# Patient Record
Sex: Male | Born: 1990 | Hispanic: No | Marital: Single | Smoking: Former smoker
Health system: Southern US, Community
[De-identification: ages and names within clinical notes are randomized; demographics above are authoritative.]

## PROBLEM LIST (undated history)

## (undated) DIAGNOSIS — J45909 Unspecified asthma, uncomplicated: Secondary | ICD-10-CM

---
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
## 2010-03-23 ENCOUNTER — Emergency Department (HOSPITAL_COMMUNITY): Admission: EM | Admit: 2010-03-23 | Discharge: 2010-03-23 | Payer: Self-pay | Admitting: Emergency Medicine

## 2010-09-11 ENCOUNTER — Emergency Department (HOSPITAL_COMMUNITY)
Admission: EM | Admit: 2010-09-11 | Discharge: 2010-09-11 | Payer: Self-pay | Source: Home / Self Care | Admitting: Emergency Medicine

## 2011-04-16 ENCOUNTER — Emergency Department (HOSPITAL_COMMUNITY)
Admission: EM | Admit: 2011-04-16 | Discharge: 2011-04-16 | Disposition: A | Discharge status: Home / Self Care | Payer: Self-pay | Attending: Emergency Medicine | Admitting: Emergency Medicine

## 2011-04-16 DIAGNOSIS — H60399 Other infective otitis externa, unspecified ear: Secondary | ICD-10-CM | POA: Insufficient documentation

## 2011-04-16 DIAGNOSIS — H9209 Otalgia, unspecified ear: Secondary | ICD-10-CM | POA: Insufficient documentation

## 2012-06-19 ENCOUNTER — Encounter (HOSPITAL_COMMUNITY): Payer: Self-pay | Admitting: Emergency Medicine

## 2012-06-19 ENCOUNTER — Emergency Department (HOSPITAL_COMMUNITY)
Admission: EM | Admit: 2012-06-19 | Discharge: 2012-06-19 | Disposition: A | Discharge status: Home / Self Care | Payer: Self-pay | Attending: Emergency Medicine | Admitting: Emergency Medicine

## 2012-06-19 DIAGNOSIS — F17200 Nicotine dependence, unspecified, uncomplicated: Secondary | ICD-10-CM | POA: Insufficient documentation

## 2012-06-19 DIAGNOSIS — J45909 Unspecified asthma, uncomplicated: Secondary | ICD-10-CM | POA: Insufficient documentation

## 2012-06-19 DIAGNOSIS — R091 Pleurisy: Secondary | ICD-10-CM

## 2012-06-19 DIAGNOSIS — R51 Headache: Secondary | ICD-10-CM | POA: Insufficient documentation

## 2012-06-19 DIAGNOSIS — R071 Chest pain on breathing: Secondary | ICD-10-CM | POA: Insufficient documentation

## 2012-06-19 HISTORY — DX: Unspecified asthma, uncomplicated: J45.909

## 2012-06-19 MED ORDER — IBUPROFEN 800 MG PO TABS
800.0000 mg | ORAL_TABLET | Freq: Once | ORAL | Status: AC
  Administered 2012-06-19: 800 mg via ORAL
  Filled 2012-06-19: qty 1

## 2012-06-19 MED ORDER — ACETAMINOPHEN 325 MG PO TABS
650.0000 mg | ORAL_TABLET | Freq: Once | ORAL | Status: AC
  Administered 2012-06-19: 650 mg via ORAL
  Filled 2012-06-19: qty 2

## 2012-06-19 NOTE — ED Provider Notes (Signed)
History     CSN: [id number]  Arrival date & time 06/19/12  1002   First MD Initiated Contact with Patient 06/19/12 1013      Chief Complaint  Patient presents with  . Headache    temporal  . Pleurisy    chest pain on inhalation    (Consider location/radiation/quality/duration/timing/severity/associated sxs/prior treatment) HPI  Patient with left sided chest pain began at 0600 this a.m.  Increases with inspiration especially deep breath.  No dyspnea,cough, or fever.  Patient states headache like prior migraine bitemporal and usually occur bimonthly.  This began last night with mild headache followed by worsening headache, no associated visual changes nausea or vomiting, fever, chills.  Pain like prior headache. Patient took ibuprofen without relief.   Past Medical History  Diagnosis Date  . Asthma     History reviewed. No pertinent past surgical history.  No family history on file.  History  Substance Use Topics  . Smoking status: Current Every Day Smoker -- 0.5 packs/day    Types: Cigarettes  . Smokeless tobacco: Never Used  . Alcohol Use:       Review of Systems  Constitutional: Negative for fever and chills.  HENT: Negative for neck stiffness.   Eyes: Negative for visual disturbance.  Respiratory: Negative for shortness of breath.   Cardiovascular: Negative for chest pain.  Gastrointestinal: Negative for vomiting, diarrhea and blood in stool.  Genitourinary: Negative for dysuria, frequency and decreased urine volume.  Musculoskeletal: Negative for myalgias and joint swelling.  Skin: Negative for rash.  Neurological: Negative for weakness.  Hematological: Negative for adenopathy.  Psychiatric/Behavioral: Negative for agitation.    Allergies  Review of patient's allergies indicates no known allergies.  Home Medications   Current Outpatient Rx  Name Route Sig Dispense Refill  . IBUPROFEN 200 MG PO TABS Oral Take 200 mg by mouth every 6 (six) hours as  needed. Pain      BP 122/69  Pulse 82  Temp 98.5 F (36.9 C) (Oral)  Resp 16  Ht 5\' 10"  (1.778 m)  Wt 156 lb (70.761 kg)  BMI 22.38 kg/m2  SpO2 100%  Physical Exam  Nursing note and vitals reviewed. Constitutional: He is oriented to person, place, and time. He appears well-developed and well-nourished.  HENT:  Head: Normocephalic and atraumatic.  Right Ear: External ear normal.  Left Ear: External ear normal.  Nose: Nose normal.  Mouth/Throat: Oropharynx is clear and moist.  Eyes: Conjunctivae normal and EOM are normal. Pupils are equal, round, and reactive to light.  Neck: Normal range of motion. Neck supple.  Cardiovascular: Normal rate, regular rhythm, normal heart sounds and intact distal pulses.   Pulmonary/Chest: Effort normal and breath sounds normal. No respiratory distress. He has no wheezes. He exhibits no tenderness.  Abdominal: Soft. Bowel sounds are normal. He exhibits no distension and no mass. There is no tenderness. There is no guarding.  Musculoskeletal: Normal range of motion.  Neurological: He is alert and oriented to person, place, and time. He has normal reflexes. He exhibits normal muscle tone. Coordination normal.  Skin: Skin is warm and dry.  Psychiatric: He has a normal mood and affect. His behavior is normal. Judgment and thought content normal.    ED Course  Procedures (including critical care time)  Labs Reviewed - No data to display No results found.   No diagnosis found.    MDM    Patient perc negative with pleuritic chest pain.  Patient advised tylenol and continued ibuprofen  for chest pain and headache.        Hilario Quarry, MD 06/19/12 [phone]

## 2012-06-19 NOTE — Discharge Instructions (Signed)
plPleurisy Pleurisy is an irritation (inflammation) and puffiness (swelling) of the lining of the lungs. It is often caused by an existing infection or disease. It can be hard to breathe and hurt to breathe. Coughing or deep breathing will make it hurt more. HOME CARE  Only take medicine as told by your doctor.   Take your medicines (antibiotics) as told. Finish them even if you start to feel better.   Use a cool mist vaporizer to loosen mucus.  GET HELP RIGHT AWAY IF:   Your lips, fingernails, or toenails are blue or dark.   You cough up blood.   You have a hard time breathing.   Your pain is not controlled with medicine or it lasts for more than 1 week.   Your pain spreads (radiates) into your neck, arms, or jaw.   You are short of breath or wheezing.   You develop a fever, rash, throw up (vomit), or faint.   Your pain gets worse.   You cough up more yellowish white (pus-like) mucus.  MAKE SURE YOU:   Understand these instructions.   Will watch your condition.   Will get help right away if you are not doing well or get worse.  Document Released: 09/04/2008 Document Revised: 09/11/2011 Document Reviewed: 12/20/2009 [facility] Patient Information 2012 [place], [place].Migraine Headache A migraine headache is an intense, throbbing pain on one or both sides of your head. The exact cause of a migraine headache is not always known. A migraine may be caused when nerves in the brain become irritated and release chemicals that cause swelling within blood vessels, causing pain. Many migraine sufferers have a family history of migraines. Before you get a migraine you may or may not get an aura. An aura is a group of symptoms that can predict the beginning of a migraine. An aura may include:  Visual changes such as:   Flashing lights.   Bright spots or zig-zag lines.   Tunnel vision.   Feelings of numbness.   Trouble talking.   Muscle weakness.  SYMPTOMS  Pain on one or both  sides of your head.   Pain that is pulsating or throbbing in nature.   Pain that is severe enough to prevent daily activities.   Pain that is aggravated by any daily physical activity.   Nausea (feeling sick to your stomach), vomiting, or both.   Pain with exposure to bright lights, loud noises, or activity.   General sensitivity to bright lights or loud noises.  MIGRAINE TRIGGERS Examples of triggers of migraine headaches include:   Alcohol.   Smoking.   Stress.   It may be related to menses (male menstruation).   Aged cheeses.   Foods or drinks that contain nitrates, glutamate, aspartame, or tyramine.   Lack of sleep.   Chocolate.   Caffeine.   Hunger.   Medications such as nitroglycerine (used to treat chest pain), birth control pills, estrogen, and some blood pressure medications.  DIAGNOSIS  A migraine headache is often diagnosed based on:  Symptoms.   Physical examination.   A computerized X-Jerrold Haskell scan (computed tomography, CT) of your head.  TREATMENT  Medications can help prevent migraines if they are recurrent or should they become recurrent. Your caregiver can help you with a medication or treatment program that will be helpful to you.   Lying down in a dark, quiet room may be helpful.   Keeping a headache diary may help you find a trend as to what may be  triggering your headaches.  SEEK IMMEDIATE MEDICAL CARE IF:   You have confusion, personality changes or seizures.   You have headaches that wake you from sleep.   You have an increased frequency in your headaches.   You have a stiff neck.   You have a loss of vision.   You have muscle weakness.   You start losing your balance or have trouble walking.   You feel faint or pass out.  MAKE SURE YOU:   Understand these instructions.   Will watch your condition.   Will get help right away if you are not doing well or get worse.  Document Released: 09/22/2005 Document Revised:  09/11/2011 Document Reviewed: 05/08/2009 [facility] Patient Information 2012 [place], [place].

## 2012-06-19 NOTE — ED Notes (Signed)
Pt presents w/ temporal headache onset yesterday. And chest pain w/ inspiration. Has been lifting heavy boxes at work. Pt took ibuprofren 2230 last p.m. W/o relief.

## 2012-09-07 ENCOUNTER — Encounter (HOSPITAL_COMMUNITY): Payer: Self-pay | Admitting: *Deleted

## 2012-09-07 ENCOUNTER — Emergency Department (HOSPITAL_COMMUNITY)
Admission: EM | Admit: 2012-09-07 | Discharge: 2012-09-07 | Disposition: A | Discharge status: Home / Self Care | Payer: Self-pay | Attending: Emergency Medicine | Admitting: Emergency Medicine

## 2012-09-07 DIAGNOSIS — B079 Viral wart, unspecified: Secondary | ICD-10-CM | POA: Insufficient documentation

## 2012-09-07 DIAGNOSIS — F17200 Nicotine dependence, unspecified, uncomplicated: Secondary | ICD-10-CM | POA: Insufficient documentation

## 2012-09-07 DIAGNOSIS — J45909 Unspecified asthma, uncomplicated: Secondary | ICD-10-CM | POA: Insufficient documentation

## 2012-09-07 MED ORDER — BACITRACIN-NEOMYCIN-POLYMYXIN 400-5-5000 EX OINT
TOPICAL_OINTMENT | Freq: Once | CUTANEOUS | Status: AC
  Administered 2012-09-07: 02:00:00 via TOPICAL
  Filled 2012-09-07: qty 1

## 2012-09-07 NOTE — ED Provider Notes (Signed)
History     CSN: [id number]  Arrival date & time 09/07/12  0109   First MD Initiated Contact with Patient 09/07/12 0129      No chief complaint on file.   (Consider location/radiation/quality/duration/timing/severity/associated sxs/prior treatment) HPI Comments: patient with acute onset of growth on L ear lobe that has slowly gotten larger and occasionally bleeds   The history is provided by the patient.    Past Medical History  Diagnosis Date  . Asthma     History reviewed. No pertinent past surgical history.  No family history on file.  History  Substance Use Topics  . Smoking status: Current Every Day Smoker -- 0.5 packs/day    Types: Cigarettes  . Smokeless tobacco: Never Used  . Alcohol Use:       Review of Systems  Constitutional: Negative for fever and chills.  HENT: Negative for ear pain.   Skin: Positive for wound.  Neurological: Negative for dizziness and headaches.    Allergies  Review of patient's allergies indicates no known allergies.  Home Medications   Current Outpatient Rx  Name  Route  Sig  Dispense  Refill  . IBUPROFEN 200 MG PO TABS   Oral   Take 200 mg by mouth every 6 (six) hours as needed. Pain           BP 135/68  Pulse 72  Temp 97.8 F (36.6 C) (Oral)  Resp 18  SpO2 100%  Physical Exam  Constitutional: He appears well-developed and well-nourished.  HENT:  Head: Normocephalic.  Right Ear: External ear normal.  Ears:  Eyes: Pupils are equal, round, and reactive to light.  Neck: Normal range of motion.  Cardiovascular: Normal rate.   Pulmonary/Chest: Effort normal.  Abdominal: Soft.  Musculoskeletal: Normal range of motion.  Neurological: He is alert.  Skin: Skin is warm. No rash noted. No erythema.    ED Course  Procedures (including critical care time)  Labs Reviewed - No data to display No results found.   1. Wart       MDM   Wart on L ear lobe         Arman Filter, NP 09/07/12 0140

## 2012-09-07 NOTE — ED Provider Notes (Signed)
Medical screening examination/treatment/procedure(s) were performed by non-physician practitioner and as supervising physician I was immediately available for consultation/collaboration.    Nelia Shi, MD 09/07/12 [phone]

## 2012-09-07 NOTE — ED Notes (Signed)
Pt has area on left ear x 2 wks; states is irritated and bleeds at times

## 2012-09-07 NOTE — ED Notes (Signed)
NP at bedside.

## 2014-05-16 ENCOUNTER — Emergency Department (HOSPITAL_COMMUNITY)
Admission: EM | Admit: 2014-05-16 | Discharge: 2014-05-16 | Disposition: A | Discharge status: Home / Self Care | Payer: Self-pay | Attending: Emergency Medicine | Admitting: Emergency Medicine

## 2014-05-16 ENCOUNTER — Encounter (HOSPITAL_COMMUNITY): Payer: Self-pay | Admitting: Emergency Medicine

## 2014-05-16 DIAGNOSIS — K089 Disorder of teeth and supporting structures, unspecified: Secondary | ICD-10-CM | POA: Insufficient documentation

## 2014-05-16 DIAGNOSIS — K029 Dental caries, unspecified: Secondary | ICD-10-CM | POA: Insufficient documentation

## 2014-05-16 DIAGNOSIS — H9209 Otalgia, unspecified ear: Secondary | ICD-10-CM | POA: Insufficient documentation

## 2014-05-16 DIAGNOSIS — Z792 Long term (current) use of antibiotics: Secondary | ICD-10-CM | POA: Insufficient documentation

## 2014-05-16 DIAGNOSIS — R51 Headache: Secondary | ICD-10-CM | POA: Insufficient documentation

## 2014-05-16 DIAGNOSIS — Z791 Long term (current) use of non-steroidal anti-inflammatories (NSAID): Secondary | ICD-10-CM | POA: Insufficient documentation

## 2014-05-16 DIAGNOSIS — R6884 Jaw pain: Secondary | ICD-10-CM | POA: Insufficient documentation

## 2014-05-16 DIAGNOSIS — F17200 Nicotine dependence, unspecified, uncomplicated: Secondary | ICD-10-CM | POA: Insufficient documentation

## 2014-05-16 MED ORDER — HYDROCODONE-ACETAMINOPHEN 5-325 MG PO TABS: 1.0000 | ORAL_TABLET | ORAL | Status: DC | PRN

## 2014-05-16 MED ORDER — HYDROCODONE-ACETAMINOPHEN 5-325 MG PO TABS
1.0000 | ORAL_TABLET | Freq: Once | ORAL | Status: AC
  Administered 2014-05-16: 1 via ORAL
  Filled 2014-05-16: qty 1

## 2014-05-16 MED ORDER — IBUPROFEN 800 MG PO TABS: 800.0000 mg | ORAL_TABLET | Freq: Three times a day (TID) | ORAL | Status: DC

## 2014-05-16 MED ORDER — IBUPROFEN 800 MG PO TABS
800.0000 mg | ORAL_TABLET | Freq: Once | ORAL | Status: AC
  Administered 2014-05-16: 800 mg via ORAL
  Filled 2014-05-16: qty 1

## 2014-05-16 MED ORDER — PENICILLIN V POTASSIUM 500 MG PO TABS: 500.0000 mg | ORAL_TABLET | Freq: Three times a day (TID) | ORAL | Status: DC

## 2014-05-16 NOTE — ED Notes (Signed)
Pt c/o right side jaw pain that started last week.  Pt states that it hurts to chew and open mouth wide.  Pt states gums feels swollen and when he presses down on it he has bad taste in his mouth.

## 2014-05-16 NOTE — Discharge Instructions (Signed)
1. Medications: vicodin, penicillin, usual home medications °2. Treatment: rest, drink plenty of fluids, take medications as prescribed °3. Follow Up: Please followup with your primary doctor for discussion of your diagnoses and further evaluation after today's visit; if you do not have a primary care doctor use the resource guide provided to find one; f/u with dentistry as discussed ° ° ° °Dental Pain °A tooth ache may be caused by cavities (tooth decay). Cavities expose the nerve of the tooth to air and hot or cold temperatures. It may come from an infection or abscess (also called a boil or furuncle) around your tooth. It is also often caused by dental caries (tooth decay). This causes the pain you are having. °DIAGNOSIS  °Your caregiver can diagnose this problem by exam. °TREATMENT  °· If caused by an infection, it may be treated with medications which kill germs (antibiotics) and pain medications as prescribed by your caregiver. Take medications as directed. °· Only take over-the-counter or prescription medicines for pain, discomfort, or fever as directed by your caregiver. °· Whether the tooth ache today is caused by infection or dental disease, you should see your dentist as soon as possible for further care. °SEEK MEDICAL CARE IF: °The exam and treatment you received today has been provided on an emergency basis only. This is not a substitute for complete medical or dental care. If your problem worsens or new problems (symptoms) appear, and you are unable to meet with your dentist, call or return to this location. °SEEK IMMEDIATE MEDICAL CARE IF:  °· You have a fever. °· You develop redness and swelling of your face, jaw, or neck. °· You are unable to open your mouth. °· You have severe pain uncontrolled by pain medicine. °MAKE SURE YOU:  °· Understand these instructions. °· Will watch your condition. °· Will get help right away if you are not doing well or get worse. °Document Released: 09/22/2005 Document  Revised: 12/15/2011 Document Reviewed: 05/10/2008 °ExitCare® Patient Information ©2015 [employer]. This information is not intended to replace advice given to you by your health care provider. Make sure you discuss any questions you have with your health care provider. ° ° ° °Emergency Department Resource Guide °1) Find a Doctor and Pay Out of Pocket °Although you won't have to find out who is covered by your insurance plan, it is a good idea to ask around and get recommendations. You will then need to call the office and see if the doctor you have chosen will accept you as a new patient and what types of options they offer for patients who are self-pay. Some doctors offer discounts or will set up payment plans for their patients who do not have insurance, but you will need to ask so you aren't surprised when you get to your appointment. ° °2) Contact Your Local Health Department °Not all health departments have doctors that can see patients for sick visits, but many do, so it is worth a call to see if yours does. If you don't know where your local health department is, you can check in your phone book. The CDC also has a tool to help you locate your state's health department, and many state websites also have listings of all of their local health departments. ° °3) Find a Walk-in Clinic °If your illness is not likely to be very severe or complicated, you may want to try a walk in clinic. These are popping up all over the country in pharmacies, drugstores, and   shopping centers. They're usually staffed by nurse practitioners or physician assistants that have been trained to treat common illnesses and complaints. They're usually fairly quick and inexpensive. However, if you have serious medical issues or chronic medical problems, these are probably not your best option. ° °No Primary Care Doctor: °- Call Health Connect at  832-8000 - they can help you locate a primary care doctor that  accepts your insurance,  provides certain services, etc. °- Physician Referral Service- 1-800-533-3463 ° °Chronic Pain Problems: °Organization         Address  Phone   Notes  °[facility]  (336) 297-2271 Patients need to be referred by their primary care doctor.  ° °Medication Assistance: °Organization         Address  Phone   Notes  °[place] [facility] [address] °[place], [place] [address] (336) 641-8030 --Must be a resident of [place] °-- Must have NO insurance coverage whatsoever (no Medicaid/ Medicare, etc.) °-- The pt. MUST have a primary care doctor that directs their care regularly and follows them in the community °  °[facility]  (866) 331-1348   °[employer]  (888) 892-1162   ° °Agencies that provide inexpensive medical care: °Organization         Address  Phone   Notes  °[facility]  (336) 832-8035   °[facility]    (336) 832-7272   °[facility] [address] °[place], [place] [address] (336) 832-4777   °[facility] [address], °[place] (336) 271-4999   °[facility]    (336) 373-0678   °[facility]    (336) 272-1050   °[facility] ° [address], [place] Phone:  (336) 832-4444, Fax:  (336) 832-4440 Hours of Operation:  9 am - 6 pm, M-F.  Also accepts Medicaid/Medicare and self-pay.  °[facility] ° [address], [place] Phone: (336) 832-3150, Fax: (336) 832-3151. Hours of Operation:  8:30 am - 5:30 pm, M-F.  Also accepts Medicaid and self-pay.  °[facility] [place] [address], [place] Phone: (336) 878-6027   °[facility] [address], [place], [place] (336)723-1848, Ext. 123 Mondays & Thursdays: 7-9 AM.  First 15 patients are seen on a first come, first serve basis. °  ° °Medicaid-accepting [place] Providers: ° °Organization         Address  Phone    Notes  °[facility] [address], [place] (336) 641-2100 Also accepts self-pay patients.  °[facility] [address], [place] ° (336) 856-9996   °[facility] [address], [place] (336) 288-8857   °[facility] [address] [place] [address], [place] (336) 299-7000   °Veita Bland [address], [place]  ° (336) 373-1557 Only accepts [place] Access Medicaid patients after they have their name applied to their card.  ° °Self-Pay (no insurance) in [place]: ° °Organization         Address  Phone   Notes  °Sickle Cell Patients, [facility] [address], [place] (336) 832-1970   °[facility] [address], [place] (336) 832-4400   °[facility] [place] ° [address], [place] (336) 992-4800   °  [facility]/Dr. Osei-Bonsu ° [address] [place] [address], [place] or [address], [place] (336) 841-8500 Phone number for both [place] and [place] locations is the same.  °[facility] [address], [place] (336) 299-0000   °[facility] [address] [place] [address], [place] or [address] (336) 852-7530 °(336) 878-2260   °[facility] [address], [place] (336) 350-1642, phone; (336) 294-5005, fax Sees patients 1st and 3rd Saturday of every month.  Must not qualify for public or private insurance (i.e. Medicaid, Medicare, [place] Health Choice, Veterans' Benefits) • Household income should be no more than 200% of the poverty level •The clinic cannot treat you if you are pregnant or think you are pregnant • Sexually transmitted diseases are not treated at the clinic.  ° ° °Dental Care: °Organization         Address  Phone  Notes  °[facility] [address], [place] (336)  641-6152 Accepts children up to age 21 who are enrolled in Medicaid or [place] Health Choice; pregnant women with a Medicaid card; and children who have applied for Medicaid or [place] Health Choice, but were declined, whose parents can pay a reduced fee at time of service.  °[facility] [place]  [address], [place] (336) 641-7733 Accepts children up to age 21 who are enrolled in Medicaid or [place] Health Choice; pregnant women with a Medicaid card; and children who have applied for Medicaid or [place] Health Choice, but were declined, whose parents can pay a reduced fee at time of service.  °[facility] ° [address], [place] (336) 641-4533 Patients are seen by appointment only. Walk-ins are not accepted. [facility] will see patients 18 years of age and older. °Monday - Tuesday (8am-5pm) °Most Wednesdays (8:30-5pm) °$30 per visit, cash only  °[facility] ° [address], [place] (336) 641-4533 Patients are seen by appointment only. Walk-ins are not accepted. [facility] will see patients 18 years of age and older. °One Wednesday Evening (Monthly: Volunteer Based).  $30 per visit, cash only  °[facility]  (919) 537-3737 for adults; Children under age 4, call Graduate Pediatric Dentistry at (919) [phone]. Children aged 4-14, please call (919) [phone] to request a pediatric application. ° Dental services are provided in all areas of dental care including fillings, crowns and bridges, complete and partial dentures, implants, gum treatment, root canals, and extractions. Preventive care is also provided. Treatment is provided to both adults and children. °Patients are selected via a lottery and there is often a waiting list. °  °[facility] [address], ° ° (336) 763-8833 [email or web address] °  °[facility] [address], [place], [place] (336)723-1848, Ext.  123 Second and Fourth Thursday of each month, opens at 6:30 AM; Clinic ends at 9 AM.  Patients are seen on a first-come first-served basis, and a limited number are seen during each clinic.  ° °[facility] ° [address], [place],  (336) 723-7904   Eligibility Requirements °You must have lived in [place], [place], or [place] counties for at least the last three months. °  You cannot be eligible for state or federal sponsored healthcare insurance, including Veterans Administration, Medicaid, or Medicare. °  You generally cannot be eligible for healthcare insurance   through your employer.  °  How to apply: °Eligibility screenings are held every Tuesday and Wednesday afternoon from 1:00 pm until 4:00 pm. You do not need an appointment for the interview!  °[facility] [address], [place], [place] 336-631-2330   °[facility] [facility]  336-342-8273   °[facility]  336-703-3100   °[facility]  336-570-6415   ° °Behavioral Health Resources in the Community: °Intensive Outpatient Programs °Organization         Address  Phone  Notes  °[facility] [address], [place], [place] 336-878-6098   °[facility] [address], [place], [place] 336-832-9800   °[facility] [address], [place], [place] ° 336-882-2125   °[facility] [address],  °[place], [place] 1-800-853-5163 or 336-641-4981   °Substance Abuse Resources °Organization         Address  Phone  Notes  °[facility]  336-882-2125   °[facility]  336-784-9470   °[facility]  336-285-9073   °[facility]  336-845-3988   °Residential & Outpatient Substance Abuse Program  1-800-659-3381   °Psychological Services °Organization         Address  Phone  Notes  °[facility]  336- 832-9600   °[facility]  336- 378-7881   °[place]  [facility] [address], [place] 1-800-853-5163 or 336-641-4981   ° °Mobile Crisis Teams °Organization         Address  Phone  Notes  °[facility], Mobile Crisis Care Unit  1-877-626-1772   °Assertive °[facility] ° [address] [place], [place] 336-834-9664   °Sharon DeEsch [address] °[place] 336-554-5454   ° °Self-Help/Support Groups °Organization         Address  Phone             Notes  °[facility] - variety of support groups  336- 373-1402 Call for more information  °Narcotics Anonymous (NA), [facility] [address], °[place]  2 meetings at this location  ° °Residential Treatment Programs °Organization         Address  Phone  Notes  °[facility] [address],    °[place]  1-866-801-8205   °[facility] ° [address], [place], [place] 704-293-8524   °[facility] [address], [place] 336-845-3988 Admissions: 8am-3pm M-F  °[facility] [address]    °[place], [place] 336-841-1104   °[facility] [address], [place], [place] 336-379-7146   °[facility] [address]  °[place], [place] 336-285-9073   °[facility] Intensive Outpatient [address], [place], [place] 336-852-3033   °[facility] [address]  °[place], [place] 1-877-615-2722 or 336-784-9470   °[facility] [address] [place], [place] 336-227-7417 Accepts Medicaid  °[facility] [address]  °[place] 1-800-659-3381 Substance Abuse/Addiction Treatment  ° °[place] Behavioral Health Resources °Organization         Address  Phone  Notes  °[facility]  (888) 581-9988   °Julie Brannon, PhD [address] [place], [place]   (336) 349-5553 or (336) 951-0000   °[facility]   [address] °[place], [place] (336) 349-4454   °  [facility] [address], [place], [place] (336) 342-8316 Insurance/Medicaid/sponsorship through [facility]  °[facility] [address]                                    [place], [place] (336) 342-8316 Therapy/tele-psych/case  °[facility] [address]  ° [place], [place] (336) 349-2233    °Dr. Arfeen  (336) 349-4544   °[facility] [place]  [employer] [place] [facility] 1) [address], [place] °2) [address], [place] °3)  [address], [place] (336) 349-3220 °(336) 342-7768 ° °(336) 342-8140   °[place] [facility] (336) 342-1394 or (336) 342-3537 (After Hours)    ° ° ° °

## 2014-05-16 NOTE — ED Provider Notes (Signed)
CSN: [id number]     Arrival date & time 05/16/14  [id number] History   First MD Initiated Contact with Patient 05/16/14 1004     Chief Complaint  Patient presents with  . Jaw Pain     (Consider location/radiation/quality/duration/timing/severity/associated sxs/prior Treatment) The history is provided by the patient and medical records. No language interpreter was used.    Nicholas Blankenship is a 23 y.o. male  with a hx of asthma presents to the Emergency Department complaining of gradual, persistent, progressively worsening right sided dental and jaw pain onset pain worse. Nothing seems to make it better. He has not attempted to take any medications for his pain. He denies fever, chills, nausea, vomiting.  Patient reports he has pain with opening his mouth however he does not have any difficulty opening his mouth. He reports it has been sometime since he saw a dentist.  No other associated symptoms or aggravating or alleviating factors.   Past Medical History  Diagnosis Date  . Asthma    History reviewed. No pertinent past surgical history. No family history on file. History  Substance Use Topics  . Smoking status: Current Every Day Smoker -- 0.50 packs/day    Types: Cigarettes  . Smokeless tobacco: Never Used  . Alcohol Use: No    Review of Systems  Constitutional: Negative for fever, chills and appetite change.  HENT: Positive for dental problem and ear pain. Negative for drooling, facial swelling, nosebleeds, postnasal drip, rhinorrhea and trouble swallowing.   Eyes: Negative for pain and redness.  Respiratory: Negative for cough and wheezing.   Cardiovascular: Negative for chest pain.  Gastrointestinal: Negative for nausea, vomiting and abdominal pain.  Musculoskeletal: Negative for neck pain and neck stiffness.  Skin: Negative for color change and rash.  Neurological: Positive for headaches. Negative for weakness and light-headedness.  All other systems reviewed and are  negative.     Allergies  Review of patient's allergies indicates no known allergies.  Home Medications   Prior to Admission medications   Medication Sig Start Date End Date Taking? Authorizing Provider  HYDROcodone-acetaminophen (NORCO/VICODIN) 5-325 MG per tablet Take 1 tablet by mouth every 4 (four) hours as needed for moderate pain or severe pain. 05/16/14   Anum Palecek, PA-C  ibuprofen (ADVIL,MOTRIN) 200 MG tablet Take 200 mg by mouth every 6 (six) hours as needed. Pain    Historical Provider, MD  ibuprofen (ADVIL,MOTRIN) 800 MG tablet Take 1 tablet (800 mg total) by mouth 3 (three) times daily. 05/16/14   Maximillion Gill, PA-C  penicillin v potassium (VEETID) 500 MG tablet Take 1 tablet (500 mg total) by mouth 3 (three) times daily. 05/16/14   Monaca Wadas, PA-C   BP 131/55  Pulse 70  Temp(Src) 98.2 F (36.8 C) (Oral)  Resp 16  SpO2 99% Physical Exam  Nursing note and vitals reviewed. Constitutional: He is oriented to person, place, and time. He appears well-developed and well-nourished.  HENT:  Head: Normocephalic.  Right Ear: Tympanic membrane, external ear and ear canal normal.  Left Ear: Tympanic membrane, external ear and ear canal normal.  Nose: Nose normal. Right sinus exhibits no maxillary sinus tenderness and no frontal sinus tenderness. Left sinus exhibits no maxillary sinus tenderness and no frontal sinus tenderness.  Mouth/Throat: Uvula is midline, oropharynx is clear and moist and mucous membranes are normal. Mucous membranes are not dry. No oral lesions. Dental caries present. No uvula swelling or lacerations. No oropharyngeal exudate, posterior oropharyngeal edema, posterior oropharyngeal erythema or tonsillar abscesses.  Pain to palpation of tooth #32 with midl erythema and swelling of the associated gingiva; no gross abscess; No woody induration to the floor of the mouth No trismus   Eyes: Conjunctivae are normal. Pupils are equal, round,  and reactive to light. Right eye exhibits no discharge. Left eye exhibits no discharge.  Neck: Normal range of motion and full passive range of motion without pain. Neck supple. No rigidity. No erythema and normal range of motion present.  Full range of motion No paraspinal or midline tenderness Neck no nuchal rigidity or meningeal signs Neck supple and soft No cervical adenopathy Phonation normal  Cardiovascular: Normal rate, regular rhythm, normal heart sounds and intact distal pulses.   No murmur heard. Pulmonary/Chest: Effort normal and breath sounds normal. No respiratory distress. He has no wheezes.  Abdominal: Soft. Bowel sounds are normal. He exhibits no distension. There is no tenderness.  Lymphadenopathy:    He has no cervical adenopathy.  Neurological: He is alert and oriented to person, place, and time.  Skin: Skin is warm and dry.  Psychiatric: He has a normal mood and affect.    ED Course  Procedures (including critical care time) Labs Review Labs Reviewed - No data to display  Imaging Review No results found.   EKG Interpretation None      Patient counseled on use of narcotic pain medications. Counseled not to combine these medications with others containing tylenol. Urged not to drink alcohol, drive, or perform any other activities that requires focus while taking these medications. The patient verbalizes understanding and agrees with the plan.   MDM   Final diagnoses:  Pain due to dental caries   Nicholas Blankenship presents with dental pain.  No gross abscess.  Exam unconcerning for Ludwig's angina or spread of infection.  Will treat with penicillin and pain medicine.  Urged patient to follow-up with dentist.     I have personally reviewed patient's vitals, nursing note and any pertinent labs or imaging. At this time, it has been determined that no acute conditions requiring further emergency intervention. The patient/guardian have been advised of the  diagnosis and plan. I reviewed all labs and imaging including any potential incidental findings. We have discussed signs and symptoms that warrant return to the ED, such as difficulty talking, swallowing or high fevers.  Patient/guardian has voiced understanding and agreed to follow-up with the PCP or specialist in 1 week.  Vital signs are stable at discharge.   BP 131/55  Pulse 70  Temp(Src) 98.2 F (36.8 C) (Oral)  Resp 16  SpO2 99%        Dierdre Forth, PA-C 05/16/14 1426

## 2014-05-17 NOTE — ED Provider Notes (Signed)
Medical screening examination/treatment/procedure(s) were performed by non-physician practitioner and as supervising physician I was immediately available for consultation/collaboration.   EKG Interpretation None         Toy BakerAnthony T Demaris Leavell, MD 05/17/14 [phone]

## 2014-06-12 ENCOUNTER — Emergency Department (HOSPITAL_COMMUNITY)
Admission: EM | Admit: 2014-06-12 | Discharge: 2014-06-12 | Disposition: A | Discharge status: Home / Self Care | Payer: Self-pay | Attending: Emergency Medicine | Admitting: Emergency Medicine

## 2014-06-12 ENCOUNTER — Encounter (HOSPITAL_COMMUNITY): Payer: Self-pay | Admitting: Emergency Medicine

## 2014-06-12 DIAGNOSIS — F17200 Nicotine dependence, unspecified, uncomplicated: Secondary | ICD-10-CM | POA: Insufficient documentation

## 2014-06-12 DIAGNOSIS — R6884 Jaw pain: Secondary | ICD-10-CM | POA: Insufficient documentation

## 2014-06-12 DIAGNOSIS — Z79899 Other long term (current) drug therapy: Secondary | ICD-10-CM | POA: Insufficient documentation

## 2014-06-12 DIAGNOSIS — M26629 Arthralgia of temporomandibular joint, unspecified side: Secondary | ICD-10-CM | POA: Insufficient documentation

## 2014-06-12 DIAGNOSIS — J45909 Unspecified asthma, uncomplicated: Secondary | ICD-10-CM | POA: Insufficient documentation

## 2014-06-12 MED ORDER — IBUPROFEN 800 MG PO TABS: 800.0000 mg | ORAL_TABLET | Freq: Three times a day (TID) | ORAL | Status: DC | PRN

## 2014-06-12 MED ORDER — HYDROCODONE-ACETAMINOPHEN 5-325 MG PO TABS: 1.0000 | ORAL_TABLET | Freq: Four times a day (QID) | ORAL | Status: DC | PRN

## 2014-06-12 NOTE — Progress Notes (Signed)
  CARE MANAGEMENT ED NOTE 06/12/2014  Patient:  Nicholas Blankenship, Nicholas Blankenship   Account Number:  [id number]  Date Initiated:  06/12/2014  Documentation initiated by:  Radford Pax  Subjective/Objective Assessment:   Patient presents to ED with jaw pain and earpain for one month     Subjective/Objective Assessment Detail:     Action/Plan:   Action/Plan Detail:   Anticipated DC Date:  06/12/2014     Status Recommendation to Physician:   Result of Recommendation:    Other ED Services  Consult Working Plan    DC Planning Services  Other  PCP issues    Choice offered to / List presented to:            Status of service:  Completed, signed off  ED Comments:   ED Comments Detail:  EDCM spoke to patient and his mother at bedside.  Patient confirms he does not have a pcp or insurance living in [place].  [facility] provided patient with the pamphlet to [facility].  EDCM explained to patient and his mother that at [facility] they may speak to a Artist, Child psychotherapist, receive assistance with medications establih care and enroll for the orange card there.  EDCM explained to patient and his mother regarding [place] [facility] and how patient would need a doctor to refer him into the program.  [facility] also provided patient and his mother a list of pcps who accept self pay patients, list of discount pharmacies and websites needymeds.org and Good https://figueroa.info/ for medication assistance, phone number for DSS for [place], and Affordable care act for insurance, phone number to inquire about the orange card, and dental assistance for patients without insurance.  Patient and his mother thankful for resources.  No further EDCM needs at this time.

## 2014-06-12 NOTE — ED Notes (Signed)
Pt reports right jaw and ear pain x 1 month. Pt says it hurts to open his mouth up wide and to chew food.

## 2014-06-12 NOTE — ED Notes (Signed)
Pt knows not to take extra tylenol with prescribed medications and not to drink alcohol/drive/operate heavy machinery. Knows to follow up with DDS. No other questions/concerns. Ambulatory with steady gait. In NAD.

## 2014-06-12 NOTE — ED Provider Notes (Signed)
CSN: [id number]     Arrival date & time 06/12/14  1346 History  This chart was scribed for non-physician practitioner, Ebbie Ridge, PA-C working with Rolland Porter, MD by Greggory Stallion, ED scribe. This patient was seen in room WTR7/WTR7 and the patient's care was started at 2:42 PM.   Chief Complaint  Patient presents with  . Jaw Pain   The history is provided by the patient. No language interpreter was used.   HPI Comments: Nicholas Blankenship is a 23 y.o. male who presents to the Emergency Department complaining of worsening right jaw pain that started about one month ago. Opening his mouth wide and chewing worsens the pain and causes it to radiate into his right ear. Reports intermittent clicking when he opens and closes his mouth. Denies fever, dental pain, emesis. Denies significant PMHx.   Past Medical History  Diagnosis Date  . Asthma    History reviewed. No pertinent past surgical history. No family history on file. History  Substance Use Topics  . Smoking status: Current Every Day Smoker -- 0.50 packs/day    Types: Cigarettes  . Smokeless tobacco: Never Used  . Alcohol Use: No    Review of Systems All other systems negative except as documented in the HPI. All pertinent positives and negatives as reviewed in the HPI.  Allergies  Review of patient's allergies indicates no known allergies.  Home Medications   Prior to Admission medications   Medication Sig Start Date End Date Taking? Authorizing Provider  acetaminophen (TYLENOL) 500 MG tablet Take 1,500 mg by mouth once as needed for moderate pain.   Yes Historical Provider, MD  Cyanocobalamin (VITAMIN B-12 CR) 1000 MCG TBCR Take 1 tablet by mouth daily.   Yes Historical Provider, MD  ibuprofen (ADVIL,MOTRIN) 200 MG tablet Take 1,000 mg by mouth every 6 (six) hours as needed. Pain   Yes Historical Provider, MD  HYDROcodone-acetaminophen (NORCO/VICODIN) 5-325 MG per tablet Take 1 tablet by mouth every 6 (six) hours as needed  for moderate pain. 06/12/14   Jamesetta Orleans Lawyer, PA-C  ibuprofen (ADVIL,MOTRIN) 800 MG tablet Take 1 tablet (800 mg total) by mouth every 8 (eight) hours as needed. 06/12/14   Jamesetta Orleans Lawyer, PA-C   BP 121/70  Pulse 90  Temp(Src) 98.1 F (36.7 C) (Oral)  Resp 20  Wt 152 lb (68.947 kg)  SpO2 98%  Physical Exam  Nursing note and vitals reviewed. Constitutional: He is oriented to person, place, and time. He appears well-developed and well-nourished. No distress.  HENT:  Head: Normocephalic and atraumatic.  Pain along right TMJ. Mild click with opening and closing jaw. Wisdom teeth erupting through gums. No dental abscesses or gingival swelling.   Eyes: Conjunctivae and EOM are normal.  Neck: Neck supple.  Cardiovascular: Normal rate.   Pulmonary/Chest: Effort normal. No respiratory distress.  Musculoskeletal: Normal range of motion.  Neurological: He is alert and oriented to person, place, and time.  Skin: Skin is warm and dry.  Psychiatric: He has a normal mood and affect. His behavior is normal.    ED Course  Procedures (including critical care time)  DIAGNOSTIC STUDIES: Oxygen Saturation is 98% on RA, normal by my interpretation.    COORDINATION OF CARE: 2:45 PM-Discussed treatment plan which includes pain control and dental follow up with pt at bedside and pt agreed to plan. Advised pt to do warm compresses and chew on the left side.   Labs Review Labs Reviewed - No data to display  Imaging Review  No results found.   EKG Interpretation None      MDM   Final diagnoses:  TMJ arthralgia     I personally performed the services described in this documentation, which was scribed in my presence. The recorded information has been reviewed and is accurate.  Rolland Porter, MD 06/17/14 [phone]

## 2014-06-12 NOTE — Discharge Instructions (Signed)
Return here as needed.  Followup with dentist provided.  Use heat around the area that is painful

## 2014-09-11 ENCOUNTER — Emergency Department (HOSPITAL_COMMUNITY)
Admission: EM | Admit: 2014-09-11 | Discharge: 2014-09-11 | Disposition: A | Discharge status: Home / Self Care | Payer: Self-pay | Attending: Emergency Medicine | Admitting: Emergency Medicine

## 2014-09-11 ENCOUNTER — Encounter (HOSPITAL_COMMUNITY): Payer: Self-pay | Admitting: Emergency Medicine

## 2014-09-11 DIAGNOSIS — Z79899 Other long term (current) drug therapy: Secondary | ICD-10-CM | POA: Insufficient documentation

## 2014-09-11 DIAGNOSIS — Z720 Tobacco use: Secondary | ICD-10-CM | POA: Insufficient documentation

## 2014-09-11 DIAGNOSIS — L739 Follicular disorder, unspecified: Secondary | ICD-10-CM | POA: Insufficient documentation

## 2014-09-11 DIAGNOSIS — J45909 Unspecified asthma, uncomplicated: Secondary | ICD-10-CM | POA: Insufficient documentation

## 2014-09-11 MED ORDER — MUPIROCIN CALCIUM 2 % EX CREA: 1.0000 "application " | TOPICAL_CREAM | Freq: Two times a day (BID) | CUTANEOUS | Status: DC

## 2014-09-11 MED ORDER — DIPHENHYDRAMINE HCL 25 MG PO TABS: 25.0000 mg | ORAL_TABLET | Freq: Four times a day (QID) | ORAL | Status: DC

## 2014-09-11 MED ORDER — SULFAMETHOXAZOLE-TRIMETHOPRIM 800-160 MG PO TABS: 1.0000 | ORAL_TABLET | Freq: Two times a day (BID) | ORAL | Status: DC

## 2014-09-11 NOTE — Discharge Instructions (Signed)
Folliculitis  Folliculitis is redness, soreness, and swelling (inflammation) of the hair follicles. This condition can occur anywhere on the body. People with weakened immune systems, diabetes, or obesity have a greater risk of getting folliculitis. CAUSES  Bacterial infection. This is the most common cause.  Fungal infection.  Viral infection.  Contact with certain chemicals, especially oils and tars. Long-term folliculitis can result from bacteria that live in the nostrils. The bacteria may trigger multiple outbreaks of folliculitis over time. SYMPTOMS Folliculitis most commonly occurs on the scalp, thighs, legs, back, buttocks, and areas where hair is shaved frequently. An early sign of folliculitis is a small, white or yellow, pus-filled, itchy lesion (pustule). These lesions appear on a red, inflamed follicle. They are usually less than 0.2 inches (5 mm) wide. When there is an infection of the follicle that goes deeper, it becomes a boil or furuncle. A group of closely packed boils creates a larger lesion (carbuncle). Carbuncles tend to occur in hairy, sweaty areas of the body. DIAGNOSIS  Your caregiver can usually tell what is wrong by doing a physical exam. A sample may be taken from one of the lesions and tested in a lab. This can help determine what is causing your folliculitis. TREATMENT  Treatment may include:  Applying warm compresses to the affected areas.  Taking antibiotic medicines orally or applying them to the skin.  Draining the lesions if they contain a large amount of pus or fluid.  Laser hair removal for cases of long-lasting folliculitis. This helps to prevent regrowth of the hair. HOME CARE INSTRUCTIONS  Apply warm compresses to the affected areas as directed by your caregiver.  If antibiotics are prescribed, take them as directed. Finish them even if you start to feel better.  You may take over-the-counter medicines to relieve itching.  Do not shave  irritated skin.  Follow up with your caregiver as directed. SEEK IMMEDIATE MEDICAL CARE IF:   You have increasing redness, swelling, or pain in the affected area.  You have a fever. MAKE SURE YOU:  Understand these instructions.  Will watch your condition.  Will get help right away if you are not doing well or get worse. Document Released: 12/01/2001 Document Revised: 03/23/2012 Document Reviewed: 12/23/2011 [facility] Patient Information 2015 [place], [place]. This information is not intended to replace advice given to you by your health care provider. Make sure you discuss any questions you have with your health care provider.   Emergency Department Resource Guide 1) Find a Doctor and Pay Out of Pocket Although you won't have to find out who is covered by your insurance plan, it is a good idea to ask around and get recommendations. You will then need to call the office and see if the doctor you have chosen will accept you as a new patient and what types of options they offer for patients who are self-pay. Some doctors offer discounts or will set up payment plans for their patients who do not have insurance, but you will need to ask so you aren't surprised when you get to your appointment.  2) Contact Your Local Health Department Not all health departments have doctors that can see patients for sick visits, but many do, so it is worth a call to see if yours does. If you don't know where your local health department is, you can check in your phone book. The CDC also has a tool to help you locate your state's health department, and many state websites also have listings  of all of their local health departments.  3) Find a Walk-in Clinic If your illness is not likely to be very severe or complicated, you may want to try a walk in clinic. These are popping up all over the country in pharmacies, drugstores, and shopping centers. They're usually staffed by nurse practitioners or physician  assistants that have been trained to treat common illnesses and complaints. They're usually fairly quick and inexpensive. However, if you have serious medical issues or chronic medical problems, these are probably not your best option.  No Primary Care Doctor: - Call Health Connect at  [phone] - they can help you locate a primary care doctor that  accepts your insurance, provides certain services, etc. - Physician Referral Service- [phone]  Chronic Pain Problems: Organization         Address  Phone   Notes  [facility]  [phone] Patients need to be referred by their primary care doctor.   Medication Assistance: Organization         Address  Phone   Notes  [facility] Medication [facility] [address] [place], [place] [address] [phone]) [phone] --Must be a resident of [facility] -- Must have NO insurance coverage whatsoever (no Medicaid/ Medicare, etc.) -- The pt. MUST have a primary care doctor that directs their care regularly and follows them in the community   MedAssist  [phone]   [employer]  [phone]    Agencies that provide inexpensive medical care: Organization         Address  Phone   Notes  [facility]  [phone]   [facility]    [phone]   [facility] [address] [place], [place] [address] [phone]   [facility] [place] [address], [place] [phone]) [phone]   Planned Parenthood    [phone]   [facility]    [phone](336) [phone]   [facility]  [address], [place] Phone:  [phone], Fax:  [phone] Hours of Operation:  9 am - 6 pm, M-F.  Also accepts Medicaid/Medicare and self-pay.  [facility]  [address], [place] Phone: [phone], Fax: [phone]. Hours of Operation:  8:30 am - 5:30 pm, M-F.  Also accepts  Medicaid and self-pay.  [facility] [place] [address], [place] Phone: [phone]) [phone]   [facility] [address], [place], [place] [phone], Ext. [phone] Mondays & Thursdays: 7-9 AM.  First 15 patients are seen on a first come, first serve basis.    Medicaid-accepting [facility] Providers:  Organization         Address  Phone   Notes  [facility] [address], [place] [phone] Also accepts self-pay patients.  [facility] [address] [place], [place]  [phone]) [phone]   [facility] [address], [place] [phone]) [phone]   [facility] [address] [place] [address], [place] [phone](336) [phone]   Renaye RakersVeita Bland [address], [place]   [phone] Only accepts [place] Access [place]icaid patients after they have their name applied to their card.   Self-Pay (no insurance) in [facility] [place]:  Retail buyerrganization         Address  Phone   Notes  Sickle Cell Patients, [facility] [address], [place] [phone]) [phone]   [facility] [address], [place] [phone]   [facility] [place]  [address], [place] [phone]   [facility]/Dr. Osei-Bonsu  [address] [place] [address], [place] or [id number], [place] [phone] Phone number for both [place] and [place] locations is the same.  [facility] [address], [place] [phone]) [phone]   [facility] Anthem [address] [place] [address], [place] or [address] [phone](336) [phone]   [facility] [address], [place] [phone]) [phone], phone; [phone]) [phone], fax Sees patients 1st and 3rd Saturday of every month.  Must not qualify for public or private insurance (i.e. Medicaid, Medicare, [place] Health Choice, Veterans' Benefits)  Household  income should be no more than 200% of the poverty level The clinic cannot treat you if you are pregnant or think you are pregnant  Sexually transmitted diseases are not treated at the clinic.    Dental Care: Organization         Address  Phone  Notes  [facility] [address], [place] [phone] Accepts children up to age 23 who are enrolled in [place]icaid or [place] Health Choice; pregnant women with a Medicaid card; and children who have applied for Medicaid or [place] Health Choice, but were declined, whose parents can pay a reduced fee at time of service.  [facility] of [facility] [place]  [address], [place] [phone] Accepts children up to age 23 who are enrolled in [place]icaid or [place] Health Choice; pregnant women with a Medicaid card; and children who have applied for Medicaid or [place] Health Choice, but were declined, whose parents can pay a reduced fee at time of service.  [facility]  [address], [place] [phone]) [phone] Patients are seen by appointment only. Walk-ins are not accepted. [facility] will see patients 23 years of age and older. Monday - Tuesday (8am-5pm) Most Wednesdays (8:30-5pm) $30 per visit, cash only  [facility]  [address], [facility] [phone]) [phone] Patients are seen by appointment only. Walk-ins are not accepted. [facility] will see patients 23 years of age and older. One Wednesday Evening (Monthly: Volunteer Based).  $30 per visit, cash only  [employer] [facility] [facility]  [phone] for adults; Children under age 124, call Graduate Pediatric Dentistry at [phone]. Children aged 734-14, please call [phone] to request a pediatric application.  Dental services are provided in all areas of dental care including fillings, crowns and bridges, complete and partial dentures, implants, gum  treatment, root canals, and extractions. Preventive care is also provided. Treatment is provided to both adults and children. Patients are selected via a lottery and there is often a waiting list.   [facility] [address], [place]  [phone] [email or web address]   [facility] [address], [place], [place] [phone], Ext. [phone] Second and Fourth Thursday of each month, opens at 6:30 AM; Clinic ends at 9 AM.  Patients are seen on a first-come first-served basis, and a limited number are seen during each clinic.   [facility]  [address], [place], [place] [phone]) [phone]   Eligibility  Requirements You must have lived in [place], [place], or [place] counties for at least the last three months.   You cannot be eligible for state or federal sponsored National Cityhealthcare insurance, including CIGNAVeterans Administration, [place]icaid, or Harrah[employer] EntertainmentMedicare.   You generally cannot be eligible for healthcare insurance through your employer.    How to apply: Eligibility screenings are held every Tuesday and Wednesday afternoon from 1:00 pm until 4:00 pm. You do not need an appointment for the interview!  [facility] [address], [place], [place] [phone]   [facility]  [phone]   [facility]  [phone]   [facility]  [phone]    Behavioral Health Resources in the Community: Intensive Outpatient Programs Organization         Address  Phone  Notes  [facility] [address], [place], [place] [phone]   [facility] [address], [place], [place] [phone]   [facility] [address], [place], [place]  [phone]   [facility] [address],  [place], [place] [phone] or [phone]   Substance Abuse Resources Organization         Address  Phone  Notes  Alcohol and  Drug Services  [phone]   [facility]  [phone]   [facility]  [phone]   [facility]  [phone]   Residential & Outpatient Substance Abuse Program  [phone]   Psychological Services Organization         Address  Phone  Notes  [facility]  [phone]- [phone]   [facility]  [phone]- [phone]   [facility] [address], [place] [phone] or [phone]    Mobile Crisis Teams Organization         Address  Phone  Notes  [facility], Mobile Crisis Care Unit  [phone]   [facility]  [address] [place], [place] [phone]   Doristine LocksSharon DeEsch [address] [place] [phone]    Self-Help/Support Groups Organization         Address  Phone             Notes  [facility] of [place] - variety of support groups  [phone]- [phone] Call for more information  Narcotics Anonymous (NA), [facility] [address], [place]  2 meetings at this location   Statisticianesidential Treatment Programs Organization         Address  Phone  Notes  [facility] [address],    [place]  [phone]   [facility]  [address], [place] [phone], [place], [place] [phone]   [facility] [address], [place] [phone] Admissions: 8am-3pm M-F  [facility] [address]    [place], [place] [phone]   [facility] [address], [place], [place] [phone]   [facility] [address]  [place], [place] [phone]   [facility] - Intensive Outpatient [address], [place], [place] [phone]   [facility] ([facility] [address]  [place], [place] [phone] or [phone]   [facility] ([facility]) [address] [place], [place] [phone] Accepts Medicaid  Fellowship  [facility] [address]  [place] [phone] Substance Abuse/Addiction Treatment   [facility] Behavioral Health Resources Organization  Address  Phone  Notes  °[facility]  ([phone]) [phone]   °Julie Brannon, PhD [address] [place], [place]   ([phone]) [phone] or (336) 951-0000   ° Behavioral   [address] °[place], [employer] (336) [phone]   °[facility] [address], [place], [place] ([phone]) [phone] Insurance/Medicaid/sponsorship through [facility]  °[facility] [address]                                    [place], [place] ([phone]) [phone] Therapy/tele-psych/case  °[facility] [address]  ° [place], [place] ([phone]) [phone]    °Dr. Arfeen  ([phone]) [phone]   °[facility] [place]  [employer] [place] [facility] 1) [address],  °2) [address], [place] °3)  [address], [place] (336) [phone] °(336) [phone] ° °(336) [phone]   °[place] Child Abuse Hotline (336) 342-1394 or (336) 342-3537 (After Hours)    ° ° ° ° °

## 2014-09-11 NOTE — ED Notes (Signed)
Pt presents with itchy  pus-filled vesicles on chest and back, spreading to arms, onset two days ago. Pt denies any recent exposure to plants, denies camping.

## 2014-09-11 NOTE — ED Provider Notes (Signed)
CSN: [id number]     Arrival date & time 09/11/14  1624 History  This chart was scribed for non-physician practitioner working with Lyanne CoKevin M Campos, MD by Elveria Risingimelie Horne, ED Scribe. This patient was seen in room WTR5/WTR5 and the patient's care was started at 6:31 PM.   Chief Complaint  Patient presents with  . Rash   The history is provided by the patient. No language interpreter was used.   HPI Comments: Nicholas Blankenship is a 23 y.o. male who presents to the Emergency Department complaining of pruritic, vesicular rash onset two days ago. Patient denies complications with the rash until last night. Patient reports that the rash presented on his chest and back and has begun spreading to his arms. Patient reports that the larger patches are painful and the smaller one are itchy. Patient denies introduction of new dermatological products, new sleeping quarters, outdoor activities/plant exposures, exposure to pets.Patient denies associated fevers, chills, nausea or vomiting. Patient denies rash to his legs. Patient denies drainage from the pustules but states that some do have white heads. Patient denies frequent profuse sweating, but states that he does work near an [employer].       Past Medical History  Diagnosis Date  . Asthma    History reviewed. No pertinent past surgical history. History reviewed. No pertinent family history. History  Substance Use Topics  . Smoking status: Current Every Day Smoker -- 0.50 packs/day    Types: Cigarettes  . Smokeless tobacco: Never Used  . Alcohol Use: No    Review of Systems  Constitutional: Negative for fever and chills.  Gastrointestinal: Negative for nausea, vomiting and diarrhea.  Skin: Positive for color change and rash.    Allergies  Review of patient's allergies indicates no known allergies.  Home Medications   Prior to Admission medications   Medication Sig Start Date End Date Taking? Authorizing Provider  acetaminophen (TYLENOL) 500 MG  tablet Take 1,500 mg by mouth once as needed for moderate pain.    Historical Provider, MD  Cyanocobalamin (VITAMIN B-12 CR) 1000 MCG TBCR Take 1 tablet by mouth daily.    Historical Provider, MD  diphenhydrAMINE (BENADRYL) 25 MG tablet Take 1 tablet (25 mg total) by mouth every 6 (six) hours. 09/11/14   Jamis Kryder A Forcucci, PA-C  HYDROcodone-acetaminophen (NORCO/VICODIN) 5-325 MG per tablet Take 1 tablet by mouth every 6 (six) hours as needed for moderate pain. 06/12/14   Jamesetta Orleanshristopher W Lawyer, PA-C  ibuprofen (ADVIL,MOTRIN) 200 MG tablet Take 1,000 mg by mouth every 6 (six) hours as needed. Pain    Historical Provider, MD  ibuprofen (ADVIL,MOTRIN) 800 MG tablet Take 1 tablet (800 mg total) by mouth every 8 (eight) hours as needed. 06/12/14   Jamesetta Orleanshristopher W Lawyer, PA-C  mupirocin cream (BACTROBAN) 2 % Apply 1 application topically 2 (two) times daily. 09/11/14   Flara Storti A Forcucci, PA-C  sulfamethoxazole-trimethoprim (SEPTRA DS) 800-160 MG per tablet Take 1 tablet by mouth every 12 (twelve) hours. 09/11/14   Corneilus Heggie A Forcucci, PA-C   Triage Vitals: BP 133/68 mmHg  Pulse 82  Temp(Src) 97.8 F (36.6 C) (Oral)  Resp 16  SpO2 100% Physical Exam  Constitutional: He is oriented to person, place, and time. He appears well-developed and well-nourished. No distress.  HENT:  Head: Normocephalic and atraumatic.  Eyes: EOM are normal.  Neck: Neck supple. No tracheal deviation present.  Cardiovascular: Normal rate and regular rhythm.  Exam reveals no gallop and no friction rub.   No murmur heard. Pulmonary/Chest:  Effort normal and breath sounds normal. No respiratory distress. He has no wheezes. He has no rales. He exhibits no tenderness.  Musculoskeletal: Normal range of motion.  Neurological: He is alert and oriented to person, place, and time.  Skin: Skin is warm and dry. Rash noted. There is erythema.  Scattered discrete pustules with small surrounding erythema no active drainage. Rash along the troso  and bilateral upper extremities.   Psychiatric: He has a normal mood and affect. His behavior is normal.  Nursing note and vitals reviewed.   ED Course  Procedures (including critical care time)  COORDINATION OF CARE: 6:36 PM- Patient advised to use an anti bacterial soap when showering and to take Benadryl throughout the day if possible. Will prescribed topical antibiotic. Patient given return precautions. Discussed treatment plan with patient at bedside and patient agreed to plan.   Labs Review Labs Reviewed - No data to display  Imaging Review No results found.   EKG Interpretation None         Final diagnoses:  Folliculitis   Patient is a 23 year old male who presents emergency room for evaluation of itchy pustules on his torso and arms. Physical exam reveals discrete pustules over the chest and back and upper extremities. There is no surrounding cellulitis. Suspect that this is likely folliculitis. There is no evidence for abscesses. We will discharge home with Bactroban, Bactrim, and Benadryl. MDM. I have instructed the patient to use anti-bacterial soap especially after sweating. Patient states understanding and agreement at this time. Patient is stable for discharge.    I personally performed the services described in this documentation, which was scribed in my presence. The recorded information has been reviewed and is accurate.    Eben Burowourtney A Forcucci, PA-C 09/11/14 1843  Lyanne CoKevin M Campos, MD 09/12/14 [phone]

## 2015-03-12 ENCOUNTER — Emergency Department (HOSPITAL_COMMUNITY): Payer: Self-pay

## 2015-03-12 ENCOUNTER — Encounter (HOSPITAL_COMMUNITY): Payer: Self-pay | Admitting: Family Medicine

## 2015-03-12 ENCOUNTER — Emergency Department (HOSPITAL_COMMUNITY)
Admission: EM | Admit: 2015-03-12 | Discharge: 2015-03-12 | Disposition: A | Discharge status: Home / Self Care | Payer: Self-pay | Attending: Emergency Medicine | Admitting: Emergency Medicine

## 2015-03-12 DIAGNOSIS — R1013 Epigastric pain: Secondary | ICD-10-CM | POA: Insufficient documentation

## 2015-03-12 DIAGNOSIS — J45909 Unspecified asthma, uncomplicated: Secondary | ICD-10-CM | POA: Insufficient documentation

## 2015-03-12 DIAGNOSIS — R079 Chest pain, unspecified: Secondary | ICD-10-CM

## 2015-03-12 DIAGNOSIS — J039 Acute tonsillitis, unspecified: Secondary | ICD-10-CM

## 2015-03-12 DIAGNOSIS — J40 Bronchitis, not specified as acute or chronic: Secondary | ICD-10-CM

## 2015-03-12 DIAGNOSIS — Z720 Tobacco use: Secondary | ICD-10-CM | POA: Insufficient documentation

## 2015-03-12 DIAGNOSIS — R5383 Other fatigue: Secondary | ICD-10-CM | POA: Insufficient documentation

## 2015-03-12 DIAGNOSIS — R197 Diarrhea, unspecified: Secondary | ICD-10-CM | POA: Insufficient documentation

## 2015-03-12 DIAGNOSIS — R112 Nausea with vomiting, unspecified: Secondary | ICD-10-CM | POA: Insufficient documentation

## 2015-03-12 LAB — CBC
HEMATOCRIT: 42.3 % (ref 39.0–52.0)
Hemoglobin: 14.3 g/dL (ref 13.0–17.0)
MCH: 30.5 pg (ref 26.0–34.0)
MCHC: 33.8 g/dL (ref 30.0–36.0)
MCV: 90.2 fL (ref 78.0–100.0)
PLATELETS: 208 10*3/uL (ref 150–400)
RBC: 4.69 MIL/uL (ref 4.22–5.81)
RDW: 12.7 % (ref 11.5–15.5)
WBC: 7.9 10*3/uL (ref 4.0–10.5)

## 2015-03-12 LAB — BASIC METABOLIC PANEL
Anion gap: 9 (ref 5–15)
BUN: 7 mg/dL (ref 6–20)
CALCIUM: 9.1 mg/dL (ref 8.9–10.3)
CO2: 27 mmol/L (ref 22–32)
CREATININE: 0.94 mg/dL (ref 0.61–1.24)
Chloride: 101 mmol/L (ref 101–111)
GFR calc Af Amer: >60 mL/min (ref >60)
GLUCOSE: 96 mg/dL (ref 65–99)
POTASSIUM: 3.5 mmol/L (ref 3.5–5.1)
Sodium: 137 mmol/L (ref 135–145)

## 2015-03-12 LAB — MONONUCLEOSIS SCREEN: Mono Screen: NEGATIVE

## 2015-03-12 LAB — RAPID STREP SCREEN (MED CTR MEBANE ONLY): STREPTOCOCCUS, GROUP A SCREEN (DIRECT): NEGATIVE

## 2015-03-12 MED ORDER — GUAIFENESIN-CODEINE 100-10 MG/5ML PO SYRP: 5.0000 mL | ORAL_SOLUTION | Freq: Three times a day (TID) | ORAL | Status: DC | PRN

## 2015-03-12 MED ORDER — SODIUM CHLORIDE 0.9 % IV SOLN
INTRAVENOUS | Status: AC
  Administered 2015-03-12: 20:00:00 via INTRAVENOUS

## 2015-03-12 MED ORDER — DEXTROSE 5 % IV SOLN
1.0000 g | Freq: Once | INTRAVENOUS | Status: AC
  Administered 2015-03-12: 1 g via INTRAVENOUS
  Filled 2015-03-12: qty 10

## 2015-03-12 MED ORDER — HYDROCODONE-ACETAMINOPHEN 5-325 MG PO TABS: 1.0000 | ORAL_TABLET | Freq: Four times a day (QID) | ORAL | Status: DC | PRN

## 2015-03-12 MED ORDER — ONDANSETRON HCL 4 MG/2ML IJ SOLN
4.0000 mg | Freq: Once | INTRAMUSCULAR | Status: AC
  Administered 2015-03-12: 4 mg via INTRAVENOUS
  Filled 2015-03-12: qty 2

## 2015-03-12 NOTE — ED Notes (Addendum)
Pt here for sore throat, chest pain, headache, cough. sts since Friday. Pt sts also some N,V,D.

## 2015-03-12 NOTE — ED Provider Notes (Signed)
CSN: [id number]     Arrival date & time 03/12/15  1657 History   This chart was scribed for Kerrie Buffalo, NP working with Vanetta Mulders, MD by Evon Slack, ED Scribe. This patient was seen in room TR05C/TR05C and the patient's care was started at 6:51 PM.    Chief Complaint  Patient presents with  . Sore Throat  . Headache  . Cough   Patient is a 24 y.o. male presenting with headaches, cough, and fever. The history is provided by the patient. No language interpreter was used.  Headache Associated symptoms: congestion, cough, diarrhea, ear pain, fever, myalgias, nausea, sore throat and vomiting   Cough Associated symptoms: ear pain, fever, headaches, myalgias and sore throat   Fever Max temp prior to arrival:  104 Severity:  Moderate Onset quality:  Gradual Duration:  3 days Chronicity:  New Relieved by:  Nothing Associated symptoms: congestion, cough, diarrhea, ear pain, headaches, myalgias, nausea, sore throat and vomiting    HPI Comments: Nicholas Blankenship is a 24 y.o. male who presents to the Emergency Department complaining of throbbing HA onset 3 days prior. Pt reports cough, congestion, chest tightness, fatigue, fever (max temp 104) , sore throat, abdominal pain, nausea, vomiting and diarrhea. Pt states that he has tried robitussin with no relief. Pt doesn't report any alleviating factors.   Past Medical History  Diagnosis Date  . Asthma    History reviewed. No pertinent past surgical history. History reviewed. No pertinent family history. History  Substance Use Topics  . Smoking status: Current Every Day Smoker -- 0.50 packs/day    Types: Cigarettes  . Smokeless tobacco: Never Used  . Alcohol Use: No    Review of Systems  Constitutional: Positive for fever and appetite change.  HENT: Positive for congestion, ear pain and sore throat.   Respiratory: Positive for cough and chest tightness.   Gastrointestinal: Positive for nausea, vomiting and diarrhea.   Musculoskeletal: Positive for myalgias.  Neurological: Positive for headaches.  All other systems reviewed and are negative.    Allergies  Review of patient's allergies indicates no known allergies.  Home Medications   Prior to Admission medications   Medication Sig Start Date End Date Taking? Authorizing Provider  acetaminophen (TYLENOL) 500 MG tablet Take 1,500 mg by mouth once as needed for moderate pain.   Yes Historical Provider, MD  GuaiFENesin (MUCINEX PO) Take 15 mLs by mouth daily as needed (congestion).   Yes Historical Provider, MD  ibuprofen (ADVIL,MOTRIN) 200 MG tablet Take 1,000 mg by mouth every 6 (six) hours as needed. Pain   Yes Historical Provider, MD  HYDROcodone-acetaminophen (NORCO) 5-325 MG per tablet Take 1 tablet by mouth every 6 (six) hours as needed (for cough or pain). 03/12/15   Meko Masterson Orlene Och, NP   BP 112/53 mmHg  Pulse 93  Temp(Src) 100.8 F (38.2 C) (Oral)  Resp 16  SpO2 99%   Physical Exam  Constitutional: He is oriented to person, place, and time. He appears well-developed and well-nourished. No distress.  HENT:  Head: Normocephalic and atraumatic.  Right Ear: Tympanic membrane normal.  Left Ear: Tympanic membrane normal.  Nose: Rhinorrhea present.  Mouth/Throat: Uvula is midline. Oropharyngeal exudate, posterior oropharyngeal edema and posterior oropharyngeal erythema present. No tonsillar abscesses.  tonsils are enlarged with exudate bilaterally.   Eyes: Conjunctivae and EOM are normal.  Neck: Neck supple. No tracheal deviation present.  Anterior cervical nodes enlarged.   Cardiovascular: Normal rate and regular rhythm.   Pulmonary/Chest: Effort normal.  No respiratory distress. He has no wheezes. He has no rales.  Abdominal: Soft. Bowel sounds are normal. There is tenderness in the epigastric area. There is no CVA tenderness.  Musculoskeletal: Normal range of motion.  Lymphadenopathy:    He has cervical adenopathy.  Neurological: He is alert  and oriented to person, place, and time.  Skin: Skin is warm and dry.  Psychiatric: He has a normal mood and affect. His behavior is normal.  Nursing note and vitals reviewed.   ED Course  Procedures (including critical care time)  Labs, Rocephin 1 gram IV, Zofran 4 mg IV, IV hydration.   DIAGNOSTIC STUDIES: Oxygen Saturation is 98% on RA, normal by my interpretation.    COORDINATION OF CARE: 6:58 PM-Discussed treatment plan with pt at bedside and pt agreed to plan.       Imaging Review Dg Chest 2 View  03/12/2015   CLINICAL DATA:  Chest pain for 3 days.  EXAM: CHEST  2 VIEW  COMPARISON:  None.  FINDINGS: The heart size and mediastinal contours are within normal limits. Both lungs are clear. The visualized skeletal structures are unremarkable.  IMPRESSION: No active cardiopulmonary disease.   Electronically Signed   By: Gaylyn RongWalter  Liebkemann M.D.   On: 03/12/2015 17:44   EKG Interpretation <ECG>   Sinus tachycardia Right atrial enlargement Indeterminate axis Pulmonary disease pattern Abnormal ECG Confirmed by ZACKOWSKI  MD, SCOTT [phone]([id number]) on 03/12/2015 8:38:38 PM   EKG Interpretation   Date/Time:  Monday March 12 2015 17:10:53 EDT Ventricular Rate:  103 PR Interval:  144 QRS Duration: 78 QT Interval:  306 QTC Calculation: 400 R Axis:   151 Text Interpretation:  Sinus tachycardia Right atrial enlargement  Indeterminate axis Pulmonary disease pattern Abnormal ECG Confirmed by  ZACKOWSKI  MD, SCOTT ([id number]) on 03/12/2015 8:38:38 PM      MDM  24 y.o. male with sore throat, fever, cough and nausea x 4 days. Stable for d/c and feeling better after IV fluids and medications. Patient does not appear toxic, no meningeal signs. Swallowing without difficulty.  Discussed with the patient and all questioned fully answered. He will return if any problems arise.   Final diagnoses:  Tonsillitis  Bronchitis   I personally performed the services described in this documentation, which was  scribed in my presence. The recorded information has been reviewed and is accurate.     [address]Normand Damron BrunsvilleM Dianca Owensby, [place] 03/13/15 2031  Vanetta MuldersScott Zackowski, MD 03/14/15 [phone]

## 2015-03-14 LAB — CULTURE, GROUP A STREP: Strep A Culture: NEGATIVE

## 2015-10-12 ENCOUNTER — Emergency Department (HOSPITAL_COMMUNITY): Payer: Self-pay

## 2015-10-12 ENCOUNTER — Emergency Department (HOSPITAL_COMMUNITY)
Admission: EM | Admit: 2015-10-12 | Discharge: 2015-10-12 | Disposition: A | Discharge status: Home / Self Care | Payer: Self-pay | Attending: Emergency Medicine | Admitting: Emergency Medicine

## 2015-10-12 ENCOUNTER — Encounter (HOSPITAL_COMMUNITY): Payer: Self-pay

## 2015-10-12 DIAGNOSIS — R112 Nausea with vomiting, unspecified: Secondary | ICD-10-CM | POA: Insufficient documentation

## 2015-10-12 DIAGNOSIS — R197 Diarrhea, unspecified: Secondary | ICD-10-CM | POA: Insufficient documentation

## 2015-10-12 DIAGNOSIS — J069 Acute upper respiratory infection, unspecified: Secondary | ICD-10-CM | POA: Insufficient documentation

## 2015-10-12 DIAGNOSIS — R1013 Epigastric pain: Secondary | ICD-10-CM | POA: Insufficient documentation

## 2015-10-12 DIAGNOSIS — Z79899 Other long term (current) drug therapy: Secondary | ICD-10-CM | POA: Insufficient documentation

## 2015-10-12 DIAGNOSIS — R52 Pain, unspecified: Secondary | ICD-10-CM

## 2015-10-12 DIAGNOSIS — J45909 Unspecified asthma, uncomplicated: Secondary | ICD-10-CM | POA: Insufficient documentation

## 2015-10-12 DIAGNOSIS — F17210 Nicotine dependence, cigarettes, uncomplicated: Secondary | ICD-10-CM | POA: Insufficient documentation

## 2015-10-12 LAB — COMPREHENSIVE METABOLIC PANEL
ALK PHOS: 62 U/L (ref 38–126)
ALT: 58 U/L (ref 17–63)
AST: 65 U/L — AB (ref 15–41)
Albumin: 4.3 g/dL (ref 3.5–5.0)
Anion gap: 10 (ref 5–15)
BILIRUBIN TOTAL: 0.4 mg/dL (ref 0.3–1.2)
BUN: 19 mg/dL (ref 6–20)
CO2: 25 mmol/L (ref 22–32)
CREATININE: 0.95 mg/dL (ref 0.61–1.24)
Calcium: 9.1 mg/dL (ref 8.9–10.3)
Chloride: 106 mmol/L (ref 101–111)
GFR calc Af Amer: >60 mL/min (ref >60)
GLUCOSE: 97 mg/dL (ref 65–99)
POTASSIUM: 3.5 mmol/L (ref 3.5–5.1)
Sodium: 141 mmol/L (ref 135–145)
TOTAL PROTEIN: 7.4 g/dL (ref 6.5–8.1)

## 2015-10-12 LAB — URINE MICROSCOPIC-ADD ON: Squamous Epithelial / LPF: NONE SEEN

## 2015-10-12 LAB — URINALYSIS, ROUTINE W REFLEX MICROSCOPIC
BILIRUBIN URINE: NEGATIVE
Glucose, UA: NEGATIVE mg/dL
Hgb urine dipstick: NEGATIVE
KETONES UR: NEGATIVE mg/dL
LEUKOCYTES UA: NEGATIVE
Nitrite: NEGATIVE
Protein, ur: 100 mg/dL — AB
Specific Gravity, Urine: 1.044 — ABNORMAL HIGH (ref 1.005–1.030)
pH: 6 (ref 5.0–8.0)

## 2015-10-12 LAB — CBC
HCT: 47.7 % (ref 39.0–52.0)
Hemoglobin: 15.8 g/dL (ref 13.0–17.0)
MCH: 30.4 pg (ref 26.0–34.0)
MCHC: 33.1 g/dL (ref 30.0–36.0)
MCV: 91.9 fL (ref 78.0–100.0)
Platelets: 144 10*3/uL — ABNORMAL LOW (ref 150–400)
RBC: 5.19 MIL/uL (ref 4.22–5.81)
RDW: 13.3 % (ref 11.5–15.5)
WBC: 4.7 10*3/uL (ref 4.0–10.5)

## 2015-10-12 LAB — LIPASE, BLOOD: LIPASE: 27 U/L (ref 11–51)

## 2015-10-12 MED ORDER — MORPHINE SULFATE (PF) 4 MG/ML IV SOLN
4.0000 mg | Freq: Once | INTRAVENOUS | Status: AC
  Administered 2015-10-12: 4 mg via INTRAVENOUS
  Filled 2015-10-12: qty 1

## 2015-10-12 MED ORDER — SODIUM CHLORIDE 0.9 % IV BOLUS (SEPSIS)
1000.0000 mL | Freq: Once | INTRAVENOUS | Status: AC
  Administered 2015-10-12: 1000 mL via INTRAVENOUS

## 2015-10-12 MED ORDER — ACETAMINOPHEN 325 MG PO TABS
650.0000 mg | ORAL_TABLET | Freq: Once | ORAL | Status: AC
Start: 2015-10-12 — End: 2015-10-12
  Administered 2015-10-12: 650 mg via ORAL
  Filled 2015-10-12: qty 2

## 2015-10-12 MED ORDER — ONDANSETRON HCL 4 MG/2ML IJ SOLN
4.0000 mg | INTRAMUSCULAR | Status: AC
  Administered 2015-10-12: 4 mg via INTRAVENOUS
  Filled 2015-10-12: qty 2

## 2015-10-12 MED ORDER — ONDANSETRON 4 MG PO TBDP: 4.0000 mg | ORAL_TABLET | Freq: Three times a day (TID) | ORAL | Status: DC | PRN

## 2015-10-12 NOTE — ED Provider Notes (Signed)
CSN: [id number]     Arrival date & time 10/12/15  1027 History   First MD Initiated Contact with Patient 10/12/15 1129     Chief Complaint  Patient presents with  . Generalized Body Aches  . Emesis  . Diarrhea    HPI   Nicholas Blankenship is a 25 y.o. male with a PMH of asthma who presents to the ED with subjective fever, generalized body aches, nasal congestion, non-productive cough, epigastric abdominal pain, and N/V/D x 3 days. He reports his symptoms are constant. He reports movement exacerbates his pain. He has tried ibuprofen at home for symptom relief. He denies hematemesis, hematochezia, melena.   Past Medical History  Diagnosis Date  . Asthma    History reviewed. No pertinent past surgical history. History reviewed. No pertinent family history. Social History  Substance Use Topics  . Smoking status: Current Every Day Smoker -- 0.00 packs/day    Types: Cigarettes  . Smokeless tobacco: Never Used  . Alcohol Use: No      Review of Systems  Constitutional: Positive for fever and chills.  HENT: Positive for congestion.   Respiratory: Positive for cough.   Gastrointestinal: Positive for nausea, vomiting, abdominal pain and diarrhea. Negative for constipation.  Genitourinary: Negative for dysuria, urgency and frequency.  All other systems reviewed and are negative.     Allergies  Review of patient's allergies indicates no known allergies.  Home Medications   Prior to Admission medications   Medication Sig Start Date End Date Taking? Authorizing Provider  acetaminophen (TYLENOL) 500 MG tablet Take 1,500 mg by mouth once as needed for moderate pain.   Yes Historical Provider, MD  ibuprofen (ADVIL,MOTRIN) 200 MG tablet Take 800 mg by mouth every 6 (six) hours as needed for fever or moderate pain. Pain   Yes Historical Provider, MD  Phenylephrine-DM-GG-APAP (TYLENOL COLD/FLU SEVERE PO) Take 2 tablets by mouth every 4 (four) hours as needed (cold symptoms).   Yes  Historical Provider, MD  albuterol (PROVENTIL HFA;VENTOLIN HFA) 108 (90 Base) MCG/ACT inhaler Inhale 2 puffs into the lungs every 6 (six) hours as needed for wheezing or shortness of breath. Reported on 10/12/2015    Historical Provider, MD  ondansetron (ZOFRAN ODT) 4 MG disintegrating tablet Take 1 tablet (4 mg total) by mouth every 8 (eight) hours as needed for nausea. 10/12/15   Mady Gemma, PA-C    BP 118/68 mmHg  Pulse 79  Temp(Src) 98.4 F (36.9 C) (Oral)  Resp 16  SpO2 100% Physical Exam  Constitutional: He is oriented to person, place, and time. He appears well-developed and well-nourished. No distress.  HENT:  Head: Normocephalic and atraumatic.  Right Ear: External ear normal.  Left Ear: External ear normal.  Nose: Nose normal.  Mouth/Throat: Uvula is midline, oropharynx is clear and moist and mucous membranes are normal.  Eyes: Conjunctivae, EOM and lids are normal. Pupils are equal, round, and reactive to light. Right eye exhibits no discharge. Left eye exhibits no discharge. No scleral icterus.  Neck: Normal range of motion. Neck supple.  Cardiovascular: Normal rate, regular rhythm, normal heart sounds, intact distal pulses and normal pulses.   Pulmonary/Chest: Effort normal and breath sounds normal. No respiratory distress. He has no wheezes. He has no rales.  Abdominal: Soft. Normal appearance and bowel sounds are normal. He exhibits no distension and no mass. There is tenderness. There is no rigidity, no rebound and no guarding.  Mild TTP in epigastrium. No rebound, guarding, or masses.  Musculoskeletal:  Normal range of motion. He exhibits no edema or tenderness.  Neurological: He is alert and oriented to person, place, and time. He has normal strength. No sensory deficit.  Skin: Skin is warm, dry and intact. No rash noted. He is not diaphoretic. No erythema. No pallor.  Psychiatric: He has a normal mood and affect. His speech is normal and behavior is normal.   Nursing note and vitals reviewed.   ED Course  Procedures (including critical care time)  Labs Review Labs Reviewed  COMPREHENSIVE METABOLIC PANEL - Abnormal; Notable for the following:    AST 65 (*)    All other components within normal limits  CBC - Abnormal; Notable for the following:    Platelets 144 (*)    All other components within normal limits  URINALYSIS, ROUTINE W REFLEX MICROSCOPIC (NOT AT [facility]) - Abnormal; Notable for the following:    Color, Urine AMBER (*)    APPearance CLOUDY (*)    Specific Gravity, Urine 1.044 (*)    Protein, ur 100 (*)    All other components within normal limits  URINE MICROSCOPIC-ADD ON - Abnormal; Notable for the following:    Bacteria, UA MANY (*)    Casts HYALINE CASTS (*)    All other components within normal limits  URINE CULTURE  LIPASE, BLOOD    Imaging Review Dg Chest 2 View  10/12/2015  CLINICAL DATA:  Cough and congestion.  Chest pain. EXAM: CHEST  2 VIEW COMPARISON:  03/12/2015 FINDINGS: The heart size and mediastinal contours are within normal limits. Both lungs are clear. The visualized skeletal structures are unremarkable. IMPRESSION: No active cardiopulmonary disease. Electronically Signed   By: Marlan Palauharles  Clark M.D.   On: 10/12/2015 11:10   I have personally reviewed and evaluated these images and lab results as part of my medical decision-making.   EKG Interpretation None      MDM   Final diagnoses:  Body aches  URI (upper respiratory infection)  Non-intractable vomiting with nausea, vomiting of unspecified type  Diarrhea, unspecified type    25 year old male presents with subjective fever, generalized body aches, nasal congestion, non-productive cough, epigastric abdominal pain, and N/V/D x 3 days.   Patient is afebrile. Vital signs stable. Heart RRR. Lungs clear to auscultation bilaterally. Abdomen soft, non-distended, with mild TTP in epigastric region. No rebound, guarding, or masses.  Patient given 1L NS,  pain medication, antiemetic for symptoms.  CBC negative for leukocytosis or anemia. CMP remarkable for mildly elevated AST. Lipase within normal limits. UA negative for nitrites or leukocytes. Chest x-ray no active cardiopulmonary disease.  Patient is nontoxic and well-appearing, and reports significant symptom improvement s/p medication administration. Feel he is stable for discharge at this time. Symptoms likely viral. Will discharge with zofran for nausea. Advised to increase fluid intake. Patient to follow up with PCP for repeat LFTs. Return precautions discussed. Patient verbalizes his understanding and is in agreement with plan.  BP 118/68 mmHg  Pulse 79  Temp(Src) 98.4 F (36.9 C) (Oral)  Resp 16  SpO2 100%   Mady Gemmalizabeth C Murdock Jellison, PA-C 10/12/15 1510  Pricilla LovelessScott Goldston, MD 10/16/15 1510

## 2015-10-12 NOTE — Discharge Instructions (Signed)
1. Medications: zofran for nausea, usual home medications 2. Treatment: rest, drink plenty of fluids 3. Follow Up: please followup with your primary doctor for recheck of your liver enzymes and for discussion of your diagnoses and further evaluation after today's visit; if you do not have a primary care doctor use the resource guide provided to find one; please return to the ER for high fever, increased pain, persistent vomiting, new or worsening symptoms   Nausea and Vomiting Nausea is a sick feeling that often comes before throwing up (vomiting). Vomiting is a reflex where stomach contents come out of your mouth. Vomiting can cause severe loss of body fluids (dehydration). Children and elderly adults can become dehydrated quickly, especially if they also have diarrhea. Nausea and vomiting are symptoms of a condition or disease. It is important to find the cause of your symptoms. CAUSES   Direct irritation of the stomach lining. This irritation can result from increased acid production (gastroesophageal reflux disease), infection, food poisoning, taking certain medicines (such as nonsteroidal anti-inflammatory drugs), alcohol use, or tobacco use.  Signals from the brain.These signals could be caused by a headache, heat exposure, an inner ear disturbance, increased pressure in the brain from injury, infection, a tumor, or a concussion, pain, emotional stimulus, or metabolic problems.  An obstruction in the gastrointestinal tract (bowel obstruction).  Illnesses such as diabetes, hepatitis, gallbladder problems, appendicitis, kidney problems, cancer, sepsis, atypical symptoms of a heart attack, or eating disorders.  Medical treatments such as chemotherapy and radiation.  Receiving medicine that makes you sleep (general anesthetic) during surgery. DIAGNOSIS Your caregiver may ask for tests to be done if the problems do not improve after a few days. Tests may also be done if symptoms are severe or  if the reason for the nausea and vomiting is not clear. Tests may include:  Urine tests.  Blood tests.  Stool tests.  Cultures (to look for evidence of infection).  X-rays or other imaging studies. Test results can help your caregiver make decisions about treatment or the need for additional tests. TREATMENT You need to stay well hydrated. Drink frequently but in small amounts.You may wish to drink water, sports drinks, clear broth, or eat frozen ice pops or gelatin dessert to help stay hydrated.When you eat, eating slowly may help prevent nausea.There are also some antinausea medicines that may help prevent nausea. HOME CARE INSTRUCTIONS   Take all medicine as directed by your caregiver.  If you do not have an appetite, do not force yourself to eat. However, you must continue to drink fluids.  If you have an appetite, eat a normal diet unless your caregiver tells you differently.  Eat a variety of complex carbohydrates (rice, wheat, potatoes, bread), lean meats, yogurt, fruits, and vegetables.  Avoid high-fat foods because they are more difficult to digest.  Drink enough water and fluids to keep your urine clear or pale yellow.  If you are dehydrated, ask your caregiver for specific rehydration instructions. Signs of dehydration may include:  Severe thirst.  Dry lips and mouth.  Dizziness.  Dark urine.  Decreasing urine frequency and amount.  Confusion.  Rapid breathing or pulse. SEEK IMMEDIATE MEDICAL CARE IF:   You have blood or brown flecks (like coffee grounds) in your vomit.  You have black or bloody stools.  You have a severe headache or stiff neck.  You are confused.  You have severe abdominal pain.  You have chest pain or trouble breathing.  You do not urinate at  least once every 8 hours.  You develop cold or clammy skin.  You continue to vomit for longer than 24 to 48 hours.  You have a fever. MAKE SURE YOU:   Understand these  instructions.  Will watch your condition.  Will get help right away if you are not doing well or get worse.   This information is not intended to replace advice given to you by your health care provider. Make sure you discuss any questions you have with your health care provider.   Document Released: 09/22/2005 Document Revised: 12/15/2011 Document Reviewed: 02/19/2011 Elsevier Interactive Patient Education 2016 Elsevier Inc.  Diarrhea Diarrhea is frequent loose and watery bowel movements. It can cause you to feel weak and dehydrated. Dehydration can cause you to become tired and thirsty, have a dry mouth, and have decreased urination that often is dark yellow. Diarrhea is a sign of another problem, most often an infection that will not last long. In most cases, diarrhea typically lasts 2-3 days. However, it can last longer if it is a sign of something more serious. It is important to treat your diarrhea as directed by your caregiver to lessen or prevent future episodes of diarrhea. CAUSES  Some common causes include:  Gastrointestinal infections caused by viruses, bacteria, or parasites.  Food poisoning or food allergies.  Certain medicines, such as antibiotics, chemotherapy, and laxatives.  Artificial sweeteners and fructose.  Digestive disorders. HOME CARE INSTRUCTIONS  Ensure adequate fluid intake (hydration): Have 1 cup (8 oz) of fluid for each diarrhea episode. Avoid fluids that contain simple sugars or sports drinks, fruit juices, whole milk products, and sodas. Your urine should be clear or pale yellow if you are drinking enough fluids. Hydrate with an oral rehydration solution that you can purchase at pharmacies, retail stores, and online. You can prepare an oral rehydration solution at home by mixing the following ingredients together:   - tsp table salt.   tsp baking soda.   tsp salt substitute containing potassium chloride.  1  tablespoons sugar.  1 L (34 oz) of  water.  Certain foods and beverages may increase the speed at which food moves through the gastrointestinal (GI) tract. These foods and beverages should be avoided and include:  Caffeinated and alcoholic beverages.  High-fiber foods, such as raw fruits and vegetables, nuts, seeds, and whole grain breads and cereals.  Foods and beverages sweetened with sugar alcohols, such as xylitol, sorbitol, and mannitol.  Some foods may be well tolerated and may help thicken stool including:  Starchy foods, such as rice, toast, pasta, low-sugar cereal, oatmeal, grits, baked potatoes, crackers, and bagels.  Bananas.  Applesauce.  Add probiotic-rich foods to help increase healthy bacteria in the GI tract, such as yogurt and fermented milk products.  Wash your hands well after each diarrhea episode.  Only take over-the-counter or prescription medicines as directed by your caregiver.  Take a warm bath to relieve any burning or pain from frequent diarrhea episodes. SEEK IMMEDIATE MEDICAL CARE IF:   You are unable to keep fluids down.  You have persistent vomiting.  You have blood in your stool, or your stools are black and tarry.  You do not urinate in 6-8 hours, or there is only a small amount of very dark urine.  You have abdominal pain that increases or localizes.  You have weakness, dizziness, confusion, or light-headedness.  You have a severe headache.  Your diarrhea gets worse or does not get better.  You have a fever  or persistent symptoms for more than 2-3 days.  You have a fever and your symptoms suddenly get worse. MAKE SURE YOU:   Understand these instructions.  Will watch your condition.  Will get help right away if you are not doing well or get worse.   This information is not intended to replace advice given to you by your health care provider. Make sure you discuss any questions you have with your health care provider.   Document Released: 09/12/2002 Document  Revised: 10/13/2014 Document Reviewed: 05/30/2012 Elsevier Interactive Patient Education 2016 Elsevier Inc.  Upper Respiratory Infection, Adult Most upper respiratory infections (URIs) are a viral infection of the air passages leading to the lungs. A URI affects the nose, throat, and upper air passages. The most common type of URI is nasopharyngitis and is typically referred to as "the common cold." URIs run their course and usually go away on their own. Most of the time, a URI does not require medical attention, but sometimes a bacterial infection in the upper airways can follow a viral infection. This is called a secondary infection. Sinus and middle ear infections are common types of secondary upper respiratory infections. Bacterial pneumonia can also complicate a URI. A URI can worsen asthma and chronic obstructive pulmonary disease (COPD). Sometimes, these complications can require emergency medical care and may be life threatening.  CAUSES Almost all URIs are caused by viruses. A virus is a type of germ and can spread from one person to another.  RISKS FACTORS You may be at risk for a URI if:   You smoke.   You have chronic heart or lung disease.  You have a weakened defense (immune) system.   You are very young or very old.   You have nasal allergies or asthma.  You work in crowded or poorly ventilated areas.  You work in health care facilities or schools. SIGNS AND SYMPTOMS  Symptoms typically develop 2-3 days after you come in contact with a cold virus. Most viral URIs last 7-10 days. However, viral URIs from the influenza virus (flu virus) can last 14-18 days and are typically more severe. Symptoms may include:   Runny or stuffy (congested) nose.   Sneezing.   Cough.   Sore throat.   Headache.   Fatigue.   Fever.   Loss of appetite.   Pain in your forehead, behind your eyes, and over your cheekbones (sinus pain).  Muscle aches.  DIAGNOSIS  Your  health care provider may diagnose a URI by:  Physical exam.  Tests to check that your symptoms are not due to another condition such as:  Strep throat.  Sinusitis.  Pneumonia.  Asthma. TREATMENT  A URI goes away on its own with time. It cannot be cured with medicines, but medicines may be prescribed or recommended to relieve symptoms. Medicines may help:  Reduce your fever.  Reduce your cough.  Relieve nasal congestion. HOME CARE INSTRUCTIONS   Take medicines only as directed by your health care provider.   Gargle warm saltwater or take cough drops to comfort your throat as directed by your health care provider.  Use a warm mist humidifier or inhale steam from a shower to increase air moisture. This may make it easier to breathe.  Drink enough fluid to keep your urine clear or pale yellow.   Eat soups and other clear broths and maintain good nutrition.   Rest as needed.   Return to work when your temperature has returned to normal or  as your health care provider advises. You may need to stay home longer to avoid infecting others. You can also use a face mask and careful hand washing to prevent spread of the virus.  Increase the usage of your inhaler if you have asthma.   Do not use any tobacco products, including cigarettes, chewing tobacco, or electronic cigarettes. If you need help quitting, ask your health care provider. PREVENTION  The best way to protect yourself from getting a cold is to practice good hygiene.   Avoid oral or hand contact with people with cold symptoms.   Wash your hands often if contact occurs.  There is no clear evidence that vitamin C, vitamin E, echinacea, or exercise reduces the chance of developing a cold. However, it is always recommended to get plenty of rest, exercise, and practice good nutrition.  SEEK MEDICAL CARE IF:   You are getting worse rather than better.   Your symptoms are not controlled by medicine.   You have  chills.  You have worsening shortness of breath.  You have brown or red mucus.  You have yellow or brown nasal discharge.  You have pain in your face, especially when you bend forward.  You have a fever.  You have swollen neck glands.  You have pain while swallowing.  You have white areas in the back of your throat. SEEK IMMEDIATE MEDICAL CARE IF:   You have severe or persistent:  Headache.  Ear pain.  Sinus pain.  Chest pain.  You have chronic lung disease and any of the following:  Wheezing.  Prolonged cough.  Coughing up blood.  A change in your usual mucus.  You have a stiff neck.  You have changes in your:  Vision.  Hearing.  Thinking.  Mood. MAKE SURE YOU:   Understand these instructions.  Will watch your condition.  Will get help right away if you are not doing well or get worse.   This information is not intended to replace advice given to you by your health care provider. Make sure you discuss any questions you have with your health care provider.   Document Released: 03/18/2001 Document Revised: 02/06/2015 Document Reviewed: 12/28/2013 Elsevier Interactive Patient Education 2016 [employer].   Emergency Department Resource Guide 1) Find a Doctor and Pay Out of Pocket Although you won't have to find out who is covered by your insurance plan, it is a good idea to ask around and get recommendations. You will then need to call the office and see if the doctor you have chosen will accept you as a new patient and what types of options they offer for patients who are self-pay. Some doctors offer discounts or will set up payment plans for their patients who do not have insurance, but you will need to ask so you aren't surprised when you get to your appointment.  2) Contact Your Local Health Department Not all health departments have doctors that can see patients for sick visits, but many do, so it is worth a call to see if yours does. If you  don't know where your local health department is, you can check in your phone book. The CDC also has a tool to help you locate your state's health department, and many state websites also have listings of all of their local health departments.  3) Find a Walk-in Clinic If your illness is not likely to be very severe or complicated, you may want to try a walk in clinic. These are  popping up all over the country in pharmacies, drugstores, and shopping centers. They're usually staffed by nurse practitioners or physician assistants that have been trained to treat common illnesses and complaints. They're usually fairly quick and inexpensive. However, if you have serious medical issues or chronic medical problems, these are probably not your best option.  No Primary Care Doctor: - Call Health Connect at  [phone] - they can help you locate a primary care doctor that  accepts your insurance, provides certain services, etc. - Physician Referral Service- [phone]  Chronic Pain Problems: Organization         Address  Phone   Notes  [facility]  [phone] Patients need to be referred by their primary care doctor.   Medication Assistance: Organization         Address  Phone   Notes  [facility] Medication [facility] [address] [place], [place] [address] [phone]) [phone] --Must be a resident of [facility] [place] -- Must have NO insurance coverage whatsoever (no Medicaid/ Medicare, etc.) -- The pt. MUST have a primary care doctor that directs their care regularly and follows them in the community   MedAssist  [phone]   [employer]  [phone]    Agencies that provide inexpensive medical care: Organization         Address  Phone   Notes  [facility]  [phone]) [phone]   [facility]    [phone]   [facility] [address] [place], [place] [address] [phone]) [phone]   [facility]  [place] [address], [place] [phone]   [facility]anned Parenthood    [phone]) [phone]   [facility]    [phone]   [facility]  [address], [place] Phone:  [phone](336) [phone], Fax:  [phone] Hours of Operation:  9 am - 6 pm, M-F.  Also accepts Medicaid/Medicare and self-pay.  [facility]  [address], [place] Phone: [phone](336) [phone], Fax: [phone]) [phone]. Hours of Operation:  8:30 am - 5:30 pm, M-F.  Also accepts Medicaid and self-pay.  [facility] [place] [address], [place] Phone: [phone]) [phone]   [facility] [address], [place], [place] [phone], Ext. [phone] Mondays & Thursdays: 7-9 AM.  First 15 patients are seen on a first come, first serve basis.    Medicaid-accepting [facility] Providers:  Organization         Address  Phone   Notes  [facility] [address], [place] [phone] Also accepts self-pay patients.  [facility] [address], [place]  [phone]   [facility] [address], [place] [phone]) [phone]   [facility] [address] [place] [address], [place] [phone]) [phone]   Renaye RakersVeita Bland [address], [place]   [phone] Only accepts [place] Access [place] patients after they have their name applied to their card.   Self-Pay (no insurance) in [facility]:  Organization         Address  Phone   Notes  Sickle Cell Patients, [facility] [address], [place] [phone]   [facility] [address], [place] [phone]) [phone]   [facility] [place]  [phone]  [address], [place] [phone]   [facility]/Dr. Osei-Bonsu  [address] [place] or [address], [place] [phone] Phone  number for both [place] and [place] locations is the same.  [facility] [address], [place] [phone]   [facility] [address] [place] or [address] [phone]   [facility] [address], [place] [phone], phone; [phone], fax Sees patients 1st and 3rd Saturday of every month.  Must not qualify for public or private insurance (i.e. Medicaid, Medicare, [place] Health Choice, Veterans' Benefits)  Household income should be no more than 200% of the poverty level The clinic cannot treat you if you are pregnant or think you are pregnant  Sexually transmitted diseases are not treated at the clinic.    Dental Care: Organization         Address  Phone  Notes  [facility] [address] [place], [place] [phone] Accepts children up to age 49 who are enrolled in [place] or [place] Health Choice; pregnant women with a Medicaid card; and children who have applied for Medicaid or [place] Health Choice, but were declined, whose parents can pay a reduced fee at time of service.  [facility] of [facility]  [address], [place] [phone] Accepts children up to age 67 who are enrolled in [place] or [place] Health Choice; pregnant women with a Medicaid card; and children who have applied for Medicaid or [place] Health Choice, but were declined, whose parents can pay a reduced fee at time of service.  [facility]  [address] [place], [place] [phone] Patients are seen by appointment only. Walk-ins are not accepted. [facility] will see patients 37 years of age and older. Monday - Tuesday (8am-5pm) Most Wednesdays (8:30-5pm) $30 per visit, cash only  [facility]  [address], [facility] [phone] Patients are seen by appointment only.  Walk-ins are not accepted. [facility] will see patients 59 years of age and older. One Wednesday Evening (Monthly: Volunteer Based).  $30 per visit, cash only  [employer]  [phone] for adults; Children under age 49, call Graduate Pediatric Dentistry at [phone]. Children aged 58-14, please call [phone] to request a pediatric application.  Dental services are provided in all areas of dental care including fillings, crowns and bridges, complete and partial dentures, implants, gum treatment, root canals, and extractions. Preventive care is also provided. Treatment is provided to both adults and children. Patients are selected via a lottery and there is often a waiting list.   [facility] [address] [place]  [phone] [email or web address]   [facility] [address] [place], [place] [phone], Ext. [phone] Second and Fourth Thursday of each month, opens at 6:30 AM; Clinic ends at 9 AM.  Patients are seen on a first-come first-served basis, and a limited number are seen during each clinic.   [facility]  [address] [place], [place] [phone]   Eligibility Requirements You must have lived in [place], [place], or [place] counties for at least the last three months.   You cannot be eligible for state or federal sponsored [place], including [employer], [place], or  Medicare.   You generally cannot be eligible for healthcare insurance through your employer.    How to apply: Eligibility screenings are held every Tuesday and Wednesday afternoon from 1:00 pm until 4:00 pm. You do not need an appointment for the interview!  [facility] [address] [place], [place] [phone]   [facility] Health Department  [phone]   [facility] Health Department  [phone]   [facility] Health Department  [phone]    Behavioral Health  Resources in the Community: Intensive Outpatient Programs Organization         Address  Phone  Notes  [facility] [address] [place], [place] [phone]   [facility] [address] [place], [place] [phone]   [facility] [address] [place], [place]  [phone]   [facility] [address],  [place], [place] [phone] or [phone]   Substance Abuse Resources Organization         Address  Phone  Notes  Alcohol [facility] Drug Services  [phone]   [facility]  [phone]   [facility] [place]  [phone]   Floydene Flock  [phone]   Residential & Outpatient Substance Abuse Program  [phone]   Psychological Services Organization         Address  Phone  Notes  [facility] Behavioral Health  [phone]   [facility]  [phone]   [facility] [address] [place] [phone] or [phone]    Mobile Crisis Teams Organization         Address  Phone  Notes  [facility], Mobile Crisis Care Unit  [phone]   Assertive [facility]  [address] [place], [place] [phone]   Doristine Locks [address] [place] [phone]    Self-Help/Support Groups Organization         Address  Phone             Notes  [facility] [place] - variety of support groups  336- [phone] Call for more information  Narcotics Anonymous (NA), [facility] [address], [employer] [place]  2 meetings at this location   Statistician         Address  Phone  Notes  [facility] [address],    [place]  [phone]   [facility]  [address], [place] [id number], [place], [place] [phone]   [facility] [address] [place], [place] [phone] Admissions: 8am-3pm M-F  [facility] [address]    [place], [place] [phone]   [facility] [address] [place], [place], [place] [phone]   [facility] [address]  [place], [place] [phone]   [facility] Intensive Outpatient [address], [place], [place] [phone]   [facility] ([facility] [address]  [place], [place] [phone] or [phone]   [facility] ([facility] [address], [place], [place] [phone] Accepts Medicaid  [facility] [address]  [place] [phone] Substance Abuse/Addiction Treatment   [facility]         Address  Phone  Notes  [facility]  [phone]   Angie Fava, PhD [address] [place], [place]   [phone] or [phone]   [facility]   [place], [place] [phone]   [facility] [address], [place], [place] [phone] Insurance/Medicaid/sponsorship through [facility] [address], [address] [place], [place] [phone] [place], [place] [phone]    Dr. Adele Schilder  [phone]   [facility] 1) [address] [place] 2) [place] 3)  [address], [place] [phone]  [phone]   [place] [phone] or [phone] (After Hours)

## 2015-10-12 NOTE — ED Notes (Signed)
Pt c/o generalized body aches, nasal/chest congestion, cough, n/v/d, and fever x 3 days.  Pain score 10/10.  Pt reports taking OTC medications w/ a little relief.  Sts daughter was recently sick and diagnosed w/ a cold.

## 2015-10-13 ENCOUNTER — Encounter (HOSPITAL_COMMUNITY): Payer: Self-pay | Admitting: Emergency Medicine

## 2015-10-13 ENCOUNTER — Emergency Department (HOSPITAL_COMMUNITY)
Admission: EM | Admit: 2015-10-13 | Discharge: 2015-10-13 | Disposition: A | Discharge status: Home / Self Care | Payer: Self-pay | Attending: Emergency Medicine | Admitting: Emergency Medicine

## 2015-10-13 DIAGNOSIS — B9789 Other viral agents as the cause of diseases classified elsewhere: Secondary | ICD-10-CM

## 2015-10-13 DIAGNOSIS — R062 Wheezing: Secondary | ICD-10-CM

## 2015-10-13 DIAGNOSIS — F17210 Nicotine dependence, cigarettes, uncomplicated: Secondary | ICD-10-CM | POA: Insufficient documentation

## 2015-10-13 DIAGNOSIS — J45901 Unspecified asthma with (acute) exacerbation: Secondary | ICD-10-CM | POA: Insufficient documentation

## 2015-10-13 DIAGNOSIS — Z79899 Other long term (current) drug therapy: Secondary | ICD-10-CM | POA: Insufficient documentation

## 2015-10-13 DIAGNOSIS — J9801 Acute bronchospasm: Secondary | ICD-10-CM

## 2015-10-13 DIAGNOSIS — J069 Acute upper respiratory infection, unspecified: Secondary | ICD-10-CM | POA: Insufficient documentation

## 2015-10-13 LAB — RAPID STREP SCREEN (MED CTR MEBANE ONLY): Streptococcus, Group A Screen (Direct): NEGATIVE

## 2015-10-13 MED ORDER — PREDNISONE 20 MG PO TABS
60.0000 mg | ORAL_TABLET | Freq: Once | ORAL | Status: AC
  Administered 2015-10-13: 60 mg via ORAL
  Filled 2015-10-13: qty 3

## 2015-10-13 MED ORDER — AEROCHAMBER Z-STAT PLUS/MEDIUM MISC
1.0000 | Freq: Once | Status: AC
  Administered 2015-10-13: 1
  Filled 2015-10-13: qty 1

## 2015-10-13 MED ORDER — PREDNISONE 20 MG PO TABS: 40.0000 mg | ORAL_TABLET | Freq: Every day | ORAL | Status: DC

## 2015-10-13 MED ORDER — ALBUTEROL SULFATE (2.5 MG/3ML) 0.083% IN NEBU
5.0000 mg | INHALATION_SOLUTION | Freq: Once | RESPIRATORY_TRACT | Status: AC
  Administered 2015-10-13: 5 mg via RESPIRATORY_TRACT
  Filled 2015-10-13: qty 6

## 2015-10-13 MED ORDER — ALBUTEROL SULFATE HFA 108 (90 BASE) MCG/ACT IN AERS
2.0000 | INHALATION_SPRAY | RESPIRATORY_TRACT | Status: DC | PRN
  Administered 2015-10-13: 2 via RESPIRATORY_TRACT
  Filled 2015-10-13: qty 6.7

## 2015-10-13 MED ORDER — BENZONATATE 100 MG PO CAPS: 100.0000 mg | ORAL_CAPSULE | Freq: Three times a day (TID) | ORAL | Status: DC

## 2015-10-13 MED ORDER — HYDROCODONE-HOMATROPINE 5-1.5 MG/5ML PO SYRP
5.0000 mL | ORAL_SOLUTION | Freq: Once | ORAL | Status: AC
  Administered 2015-10-13: 5 mL via ORAL
  Filled 2015-10-13: qty 5

## 2015-10-13 MED ORDER — FLUTICASONE PROPIONATE 50 MCG/ACT NA SUSP: 2.0000 | Freq: Every day | NASAL | Status: DC

## 2015-10-13 MED ORDER — ONDANSETRON 4 MG PO TBDP
4.0000 mg | ORAL_TABLET | Freq: Once | ORAL | Status: AC
  Administered 2015-10-13: 4 mg via ORAL
  Filled 2015-10-13: qty 1

## 2015-10-13 NOTE — ED Notes (Signed)
Per pt, states here yesterday for same symptoms-states cough and congestion

## 2015-10-13 NOTE — Discharge Instructions (Signed)
1. Medications: flonase, mucinex, tessalon, albuterol, usual home medications 2. Treatment: rest, drink plenty of fluids, take tylenol or ibuprofen for fever control 3. Follow Up: Please followup with your primary doctor in 3 days for discussion of your diagnoses and further evaluation after today's visit; if you do not have a primary care doctor use the resource guide provided to find one; Return to the ER for high fevers, difficulty breathing or other concerning symptoms   Upper Respiratory Infection, Adult Most upper respiratory infections (URIs) are a viral infection of the air passages leading to the lungs. A URI affects the nose, throat, and upper air passages. The most common type of URI is nasopharyngitis and is typically referred to as "the common cold." URIs run their course and usually go away on their own. Most of the time, a URI does not require medical attention, but sometimes a bacterial infection in the upper airways can follow a viral infection. This is called a secondary infection. Sinus and middle ear infections are common types of secondary upper respiratory infections. Bacterial pneumonia can also complicate a URI. A URI can worsen asthma and chronic obstructive pulmonary disease (COPD). Sometimes, these complications can require emergency medical care and may be life threatening.  CAUSES Almost all URIs are caused by viruses. A virus is a type of germ and can spread from one person to another.  RISKS FACTORS You may be at risk for a URI if:   You smoke.   You have chronic heart or lung disease.  You have a weakened defense (immune) system.   You are very young or very old.   You have nasal allergies or asthma.  You work in crowded or poorly ventilated areas.  You work in health care facilities or schools. SIGNS AND SYMPTOMS  Symptoms typically develop 2-3 days after you come in contact with a cold virus. Most viral URIs last 7-10 days. However, viral URIs from the  influenza virus (flu virus) can last 14-18 days and are typically more severe. Symptoms may include:   Runny or stuffy (congested) nose.   Sneezing.   Cough.   Sore throat.   Headache.   Fatigue.   Fever.   Loss of appetite.   Pain in your forehead, behind your eyes, and over your cheekbones (sinus pain).  Muscle aches.  DIAGNOSIS  Your health care provider may diagnose a URI by:  Physical exam.  Tests to check that your symptoms are not due to another condition such as:  Strep throat.  Sinusitis.  Pneumonia.  Asthma. TREATMENT  A URI goes away on its own with time. It cannot be cured with medicines, but medicines may be prescribed or recommended to relieve symptoms. Medicines may help:  Reduce your fever.  Reduce your cough.  Relieve nasal congestion. HOME CARE INSTRUCTIONS   Take medicines only as directed by your health care provider.   Gargle warm saltwater or take cough drops to comfort your throat as directed by your health care provider.  Use a warm mist humidifier or inhale steam from a shower to increase air moisture. This may make it easier to breathe.  Drink enough fluid to keep your urine clear or pale yellow.   Eat soups and other clear broths and maintain good nutrition.   Rest as needed.   Return to work when your temperature has returned to normal or as your health care provider advises. You may need to stay home longer to avoid infecting others. You can also  use a face mask and careful hand washing to prevent spread of the virus. °· Increase the usage of your inhaler if you have asthma.   °· Do not use any tobacco products, including cigarettes, chewing tobacco, or electronic cigarettes. If you need help quitting, ask your health care provider. °PREVENTION  °The best way to protect yourself from getting a cold is to practice good hygiene.  °· Avoid oral or hand contact with people with cold symptoms.   °· Wash your hands often if  contact occurs.   °There is no clear evidence that vitamin C, vitamin E, echinacea, or exercise reduces the chance of developing a cold. However, it is always recommended to get plenty of rest, exercise, and practice good nutrition.  °SEEK MEDICAL CARE IF:  °· You are getting worse rather than better.   °· Your symptoms are not controlled by medicine.   °· You have chills. °· You have worsening shortness of breath. °· You have brown or red mucus. °· You have yellow or brown nasal discharge. °· You have pain in your face, especially when you bend forward. °· You have a fever. °· You have swollen neck glands. °· You have pain while swallowing. °· You have white areas in the back of your throat. °SEEK IMMEDIATE MEDICAL CARE IF:  °· You have severe or persistent: °¨ Headache. °¨ Ear pain. °¨ Sinus pain. °¨ Chest pain. °· You have chronic lung disease and any of the following: °¨ Wheezing. °¨ Prolonged cough. °¨ Coughing up blood. °¨ A change in your usual mucus. °· You have a stiff neck. °· You have changes in your: °¨ Vision. °¨ Hearing. °¨ Thinking. °¨ Mood. °MAKE SURE YOU:  °· Understand these instructions. °· Will watch your condition. °· Will get help right away if you are not doing well or get worse. °  °This information is not intended to replace advice given to you by your health care provider. Make sure you discuss any questions you have with your health care provider. °  °Document Released: 03/18/2001 Document Revised: 02/06/2015 Document Reviewed: 12/28/2013 °Elsevier Interactive Patient Education ©2016 Elsevier Inc. ° ° ° °Emergency Department Resource Guide °1) Find a Doctor and Pay Out of Pocket °Although you won't have to find out who is covered by your insurance plan, it is a good idea to ask around and get recommendations. You will then need to call the office and see if the doctor you have chosen will accept you as a new patient and what types of options they offer for patients who are self-pay. Some  doctors offer discounts or will set up payment plans for their patients who do not have insurance, but you will need to ask so you aren't surprised when you get to your appointment. ° °2) Contact Your Local Health Department °Not all health departments have doctors that can see patients for sick visits, but many do, so it is worth a call to see if yours does. If you don't know where your local health department is, you can check in your phone book. The CDC also has a tool to help you locate your state's health department, and many state websites also have listings of all of their local health departments. ° °3) Find a Walk-in Clinic °If your illness is not likely to be very severe or complicated, you may want to try a walk in clinic. These are popping up all over the country in pharmacies, drugstores, and shopping centers. They're usually staffed by nurse practitioners or   physician assistants that have been trained to treat common illnesses and complaints. They're usually fairly quick and inexpensive. However, if you have serious medical issues or chronic medical problems, these are probably not your best option. ° °No Primary Care Doctor: °- Call Health Connect at  [phone] - they can help you locate a primary care doctor that  accepts your insurance, provides certain services, etc. °- Physician Referral Service- 1-800-533-3463 ° °Chronic Pain Problems: °Organization         Address  Phone   Notes  °[facility]  ([phone]) [phone] Patients need to be referred by their primary care doctor.  ° °Medication Assistance: °Organization         Address  Phone   Notes  °[place] [facility] [address] °[place], [place] [address] (336) 641-8030 --Must be a resident of [place] °-- Must have NO insurance coverage whatsoever (no Medicaid/ Medicare, etc.) °-- The pt. MUST have a primary care doctor that directs their care regularly and follows them in the  community °  °[facility]  (866) 331-1348   °[employer]  (888) 892-1162   ° °Agencies that provide inexpensive medical care: °Organization         Address  Phone   Notes  °[facility]  (336) 832-8035   °[facility]    (336) 832-7272   °[facility] [address] °[place], [place] [address] (336) 832-4777   °[facility] [address], °[place] (336) 271-4999   °[facility]    (336) 373-0678   °[facility]    (336) 272-1050   °[facility] ° [address], [place] Phone:  (336) [phone], Fax:  (336) [phone] Hours of Operation:  9 am - 6 pm, M-F.  Also accepts Medicaid/Medicare and self-pay.  °[facility] ° [address], [place] Phone: (336) [phone], Fax: ([phone]) [phone]. Hours of Operation:  8:30 am - 5:30 pm, M-F.  Also accepts Medicaid and self-pay.  °[facility] [place] [address], [place] Phone: (336) [phone]   °[facility] [address], [place], [place] (336)723-1848, Ext. 123 Mondays & Thursdays: 7-9 AM.  First 15 patients are seen on a first come, first serve basis. °  ° °Medicaid-accepting [place] Providers: ° °Organization         Address  Phone   Notes  °[facility] [address], [place] ([phone]) [phone] Also accepts self-pay patients.  °[facility] [address], [place] ° (336) [phone]   °[facility] [address], [place] (336) 288-8857   °[facility] [address] [place] [address], [place] (336) 299-7000   °Veita Bland [address], [place]  ° (336) [phone] Only accepts [place] Access Medicaid patients after they have their name applied to their card.  ° °Self-Pay (no insurance) in [place]: ° °Organization         Address  Phone   Notes  °Sick[facility], [facility] [address], [place] (336) 832-1970   °[facility] [address], [place] (336) 832-4400   °[facility] ° [address], [place] (336) 992-4800   °[facility]/Dr. Osei-Bonsu ° [address] [place] [address], [place] or [address], [place] ([phone]) [phone] Phone number for both [place] and [place] locations is the same.  °[facility] [address], [place] (336) 299-0000   °[facility] [address] [place] [address], [place] or [address] (336) 852-7530 °(336) 878-2260   °[facility] [address], [place] (336) 350-1642, phone; (336) 294-5005, fax Sees patients 1st and 3rd Saturday of every month.  Must not qualify for public or private insurance (i.e. Medicaid, Medicare, [place] Health Choice, Veterans' Benefits) • Household income should be no more than 200% of the poverty level •The clinic cannot treat you if you are pregnant or think you are pregnant • Sexually transmitted diseases are not treated at the clinic.  ° ° °Dental Care: °Organization         Address  Phone  Notes  °[facility] [facility] [address],  (336) 641-6152 Accepts children up to age 21 who are enrolled in Medicaid or [place] Health Choice; pregnant women with a Medicaid card; and children who have applied for Medicaid or [place] Health Choice, but were declined, whose parents can pay a reduced fee at time of service.  °[facility] [place]  [address], [place] (336) 641-7733 Accepts children up to age 21 who are enrolled in Medicaid or [place] Health Choice; pregnant women with a Medicaid card; and children who have applied for Medicaid or [place] Health Choice, but were declined, whose parents can pay a reduced fee at time of service.  °[facility] ° [address],  (336) 641-4533 Patients are seen by appointment only. Walk-ins are not accepted. [facility] will see patients 18 years of age and older. °Monday - Tuesday (8am-5pm) °Most Wednesdays (8:30-5pm) °$30 per visit, cash only  °[facility] ° [address], [place] (336) [phone] Patients are seen by appointment only. Walk-ins are not accepted. [facility] will see patients 18 years of age and older. °One Wednesday Evening (Monthly: Volunteer Based).  $30 per visit, cash only  °[facility]  ([phone]) [phone] for adults; Children under age 4, call Graduate Pediatric Dentistry at ([phone]. Children aged 4-14, please call ([phone]) [phone] to request a pediatric application. ° Dental services are provided in all areas of dental care including fillings, crowns and bridges, complete and partial dentures, implants, gum treatment, root canals, and extractions. Preventive care is also provided. Treatment is provided to both adults and children. °Patients are selected via a lottery and there is often a waiting list. °  °[facility] [address], ° ° (336) 763-8833 [email or web address] °  °[facility] [address], [place], [place] (336)723-1848, Ext. 123 Second and Fourth Thursday of each month, opens at 6:30 AM; Clinic ends at 9 AM.  Patients are seen on a first-come first-served basis, and a limited number are seen during each clinic.  ° °[facility] ° [address], [place], [place] (336) [phone]   Eligibility Requirements °You must have lived in [place], or [place] counties for at least the last three months. °  You cannot be eligible for state or federal sponsored healthcare insurance, including Veterans Administration, Medicaid, or Medicare. °  You generally cannot be eligible for healthcare insurance through your employer.  °  How to   apply: °Eligibility screenings are held every  Tuesday and Wednesday afternoon from 1:00 pm until 4:00 pm. You do not need an appointment for the interview!  °[facility] [address], [place], [place] 336-631-2330   °[facility]  336-342-8273   °[facility]  336-703-3100   °[facility]  336-570-6415   ° °Behavioral Health Resources in the Community: °Intensive Outpatient Programs °Organization         Address  Phone  Notes  °[facility] [address], [place], [place] [phone]   °[facility] [address], [place], [place] 336-832-9800   °[facility] [address], [place], [place] ° 336-882-2125   °[facility] [address],  °[place], [place] 1-800-853-5163 or 336-641-4981   °Substance Abuse Resources °Organization         Address  Phone  Notes  °[facility]  336-882-2125   °[facility]  336-784-9470   °[facility]  336-285-9073   °[facility]  336-845-3988   °Residential & Outpatient Substance Abuse Program  1-800-659-3381   °Psychological Services °Organization         Address  Phone  Notes  °[facility]  336- 832-9600   °[facility]  336- 378-7881   °[facility] [address], [place] 1-800-853-5163 or 336-641-4981   ° °Mobile Crisis Teams °Organization         Address  Phone  Notes  °[facility], Mobile Crisis Care Unit  1-877-626-1772   °Assertive °[facility] ° [address] [place], [place] 336-834-9664   °Sharon DeEsch [address] °[place] 336-554-5454   ° °Self-Help/Support Groups °Organization         Address  Phone             Notes  °[facility] [place] - variety of support groups  336- 373-1402 Call for more information  °Narcotics Anonymous (NA), [facility] [address], °[place]  2 meetings at this location   ° °Residential Treatment Programs °Organization         Address  Phone  Notes  °[facility] [address],    °[place]  1-866-801-8205   °[facility] ° [address], [place], [place] 704-293-8524   °[facility] [address], [place] 336-845-3988 Admissions: 8am-3pm M-F  °[facility] [address]    °[place], [place] 336-841-1104   °[facility] [address], [place], [place] 336-379-7146   °[facility] [address]  °[place], [place] 336-285-9073   °[facility] Intensive Outpatient [address], [place], [place] 336-852-3033   °[facility] [address]  °[place], [place] 1-877-615-2722 or 336-784-9470   °[facility] ([facility]) [address] [place], [place] 336-227-7417 Accepts Medicaid  °[facility] [address]  °[place] 1-800-659-3381 Substance Abuse/Addiction Treatment  ° °[place] Behavioral Health Resources °Organization         Address  Phone  Notes  °[facility]  (888) 581-9988   °Julie Brannon, PhD [address] [place],    (336) 349-5553 or (336) 951-0000   °[facility]   [address] °[place],  (336) 349-4454   °[facility] [address], [place],   [place] (336) 342-8316 Insurance/Medicaid/sponsorship through [facility]  °[facility] [address]                                    [place], [place] (336) 342-8316 Therapy/tele-psych/case  °[facility] [address]  ° [place], [place] (336) 349-2233    °Dr. Arfeen  (336) 349-4544   °[facility] [place]  [employer] [place] [facility] 1) [address], [place] °2) [address], [place] °3)  [address], [place] (336) 349-3220 °(336) 342-7768 ° °(336) 342-8140   °[place] [facility] (336) 342-1394 or (336) 342-3537 (After  Hours)    ° ° ° °

## 2015-10-13 NOTE — ED Provider Notes (Signed)
CSN: [id number]     Arrival date & time 10/13/15  1624 History  By signing my name below, I, Bethel Born, attest that this documentation has been prepared under the direction and in the presence of [place]. Electronically Signed: Bethel Born, ED Scribe. 10/13/2015 6:09 PM  Chief Complaint  Patient presents with  . URI   The history is provided by the patient and medical records. No language interpreter was used.   Tullio Chausse is a 25 y.o. male with PMHx of asthma who presents to the Emergency Department complaining of a worsening cough  with onset 5 days ago. The cough was initially dry but today became productive of blood tinged yellow sputum. Associated symptoms include central chest pain, SOB, and generalized myalgias.  Pt denies measured fever. He was seen yesterday in the ED where he had similar symptoms but with N/V/D. He is back today because the cough became productive. Tylenol has provided insufficient relief in symptoms at home. Pt did not have a flu shot this year. He denies international travel.  He states that he stopped smoking 2 weeks ago, prior to that he smoked 1 PPD. Pt started smoking 10 years ago.    Past Medical History  Diagnosis Date  . Asthma    No past surgical history on file. No family history on file. Social History  Substance Use Topics  . Smoking status: Current Every Day Smoker -- 0.00 packs/day    Types: Cigarettes  . Smokeless tobacco: Never Used  . Alcohol Use: No    Review of Systems  Constitutional: Negative for fever, diaphoresis, appetite change, fatigue and unexpected weight change.       Feeling hot  HENT: Negative for mouth sores.   Eyes: Negative for visual disturbance.  Respiratory: Positive for cough and shortness of breath. Negative for chest tightness and wheezing.   Cardiovascular: Positive for chest pain.  Gastrointestinal: Negative for nausea, vomiting, abdominal pain, diarrhea and constipation.   Endocrine: Negative for polydipsia, polyphagia and polyuria.  Genitourinary: Negative for dysuria, urgency, frequency and hematuria.  Musculoskeletal: Positive for myalgias. Negative for back pain and neck stiffness.  Skin: Negative for rash.  Allergic/Immunologic: Negative for immunocompromised state.  Neurological: Negative for syncope, light-headedness and headaches.  Hematological: Does not bruise/bleed easily.  Psychiatric/Behavioral: Negative for sleep disturbance. The patient is not nervous/anxious.     Allergies  Review of patient's allergies indicates no known allergies.  Home Medications   Prior to Admission medications   Medication Sig Start Date End Date Taking? Authorizing Provider  acetaminophen (TYLENOL) 500 MG tablet Take 1,500 mg by mouth once as needed for moderate pain.    Historical Provider, MD  albuterol (PROVENTIL HFA;VENTOLIN HFA) 108 (90 Base) MCG/ACT inhaler Inhale 2 puffs into the lungs every 6 (six) hours as needed for wheezing or shortness of breath. Reported on 10/12/2015    Historical Provider, MD  benzonatate (TESSALON) 100 MG capsule Take 1 capsule (100 mg total) by mouth every 8 (eight) hours. 10/13/15   Alexei Ey, PA-C  fluticasone (FLONASE) 50 MCG/ACT nasal spray Place 2 sprays into both nostrils daily. 10/13/15   Regginald Pask, PA-C  ibuprofen (ADVIL,MOTRIN) 200 MG tablet Take 800 mg by mouth every 6 (six) hours as needed for fever or moderate pain. Pain    Historical Provider, MD  ondansetron (ZOFRAN ODT) 4 MG disintegrating tablet Take 1 tablet (4 mg total) by mouth every 8 (eight) hours as needed for nausea. 10/12/15   Mady Gemma,  PA-C  Phenylephrine-DM-GG-APAP (TYLENOL COLD/FLU SEVERE PO) Take 2 tablets by mouth every 4 (four) hours as needed (cold symptoms).    Historical Provider, MD  predniSONE (DELTASONE) 20 MG tablet Take 2 tablets (40 mg total) by mouth daily. 10/13/15   Juel Ripley, PA-C   BP 122/75 mmHg  Pulse 86   Temp(Src) 98.6 F (37 C) (Oral)  Resp 16  SpO2 98% Physical Exam  Constitutional: He is oriented to person, place, and time. He appears well-developed and well-nourished. No distress.  HENT:  Head: Normocephalic and atraumatic.  Right Ear: Tympanic membrane, external ear and ear canal normal.  Left Ear: Tympanic membrane, external ear and ear canal normal.  Nose: Mucosal edema and rhinorrhea present. No epistaxis. Right sinus exhibits no maxillary sinus tenderness and no frontal sinus tenderness. Left sinus exhibits no maxillary sinus tenderness and no frontal sinus tenderness.  Mouth/Throat: Uvula is midline and mucous membranes are normal. Mucous membranes are not pale and not cyanotic. No oropharyngeal exudate, posterior oropharyngeal edema, posterior oropharyngeal erythema or tonsillar abscesses.  Eyes: Conjunctivae are normal. Pupils are equal, round, and reactive to light.  Neck: Normal range of motion and full passive range of motion without pain.  Cardiovascular: Normal rate and intact distal pulses.   Pulmonary/Chest: Effort normal. No stridor.  Coarse breath sounds with slight expiratory wheeze throughout and mild rhonchi.  TTP through the anterior chest and bilateral ribs.   Abdominal: Soft. Bowel sounds are normal. There is no tenderness.  Musculoskeletal: Normal range of motion.  Lymphadenopathy:    He has no cervical adenopathy.  Neurological: He is alert and oriented to person, place, and time.  Skin: Skin is warm and dry. No rash noted. He is not diaphoretic.  Psychiatric: He has a normal mood and affect.  Nursing note and vitals reviewed.   ED Course  Procedures (including critical care time) DIAGNOSTIC STUDIES: Oxygen Saturation is 98% on RA,  normal by my interpretation.    COORDINATION OF CARE: 6:01 PM Discussed treatment plan which includes prednisone, Hycodan,  a breathing treatment, strep screen, and  with pt at bedside and pt agreed to plan.  Labs  Review Labs Reviewed  RAPID STREP SCREEN (NOT AT [facility])  CULTURE, GROUP A STREP    Imaging Review Dg Chest 2 View  10/12/2015  CLINICAL DATA:  Cough and congestion.  Chest pain. EXAM: CHEST  2 VIEW COMPARISON:  03/12/2015 FINDINGS: The heart size and mediastinal contours are within normal limits. Both lungs are clear. The visualized skeletal structures are unremarkable. IMPRESSION: No active cardiopulmonary disease. Electronically Signed   By: Marlan Palauharles  Clark M.D.   On: 10/12/2015 11:10      MDM   Final diagnoses:  Viral URI with cough  Wheezing  Bronchospasm    Layne BentonChristopher Bruntz presents with persistent influenza-like illness and asthma exacerbation.  He was treated yesterday for similar symptoms just included nausea vomiting and diarrhea for 3 days. At that time his chest x-ray was negative. Today he presents with wheezing and shortness of breath. Albuterol given with complete resolution of wheezing.  Charge home was symptomatically before upper respiratory infection and asthma exacerbation including prednisone. Patient remains without hypoxia here in the emergency department even with walking.  BP 115/69 mmHg  Pulse 113  Temp(Src) 98.1 F (36.7 C) (Oral)  Resp 22  SpO2 100%  I personally performed the services described in this documentation, which was scribed in my presence. The recorded information has been reviewed and is accurate.  Dahlia Client Delmo Matty, PA-C 10/13/15 2044  Lorre Nick, MD 10/17/15 [phone]

## 2015-10-14 LAB — URINE CULTURE: CULTURE: NO GROWTH

## 2015-10-16 LAB — CULTURE, GROUP A STREP: Strep A Culture: NEGATIVE

## 2015-10-19 ENCOUNTER — Emergency Department (HOSPITAL_COMMUNITY)
Admission: EM | Admit: 2015-10-19 | Discharge: 2015-10-19 | Disposition: A | Discharge status: Home / Self Care | Payer: Self-pay | Attending: Emergency Medicine | Admitting: Emergency Medicine

## 2015-10-19 ENCOUNTER — Emergency Department (HOSPITAL_COMMUNITY): Payer: Self-pay

## 2015-10-19 ENCOUNTER — Encounter (HOSPITAL_COMMUNITY): Payer: Self-pay

## 2015-10-19 DIAGNOSIS — E876 Hypokalemia: Secondary | ICD-10-CM | POA: Insufficient documentation

## 2015-10-19 DIAGNOSIS — J45909 Unspecified asthma, uncomplicated: Secondary | ICD-10-CM | POA: Insufficient documentation

## 2015-10-19 DIAGNOSIS — Z79899 Other long term (current) drug therapy: Secondary | ICD-10-CM | POA: Insufficient documentation

## 2015-10-19 DIAGNOSIS — J159 Unspecified bacterial pneumonia: Secondary | ICD-10-CM | POA: Insufficient documentation

## 2015-10-19 DIAGNOSIS — F17210 Nicotine dependence, cigarettes, uncomplicated: Secondary | ICD-10-CM | POA: Insufficient documentation

## 2015-10-19 DIAGNOSIS — J189 Pneumonia, unspecified organism: Secondary | ICD-10-CM

## 2015-10-19 DIAGNOSIS — Z7951 Long term (current) use of inhaled steroids: Secondary | ICD-10-CM | POA: Insufficient documentation

## 2015-10-19 DIAGNOSIS — Z7952 Long term (current) use of systemic steroids: Secondary | ICD-10-CM | POA: Insufficient documentation

## 2015-10-19 DIAGNOSIS — R112 Nausea with vomiting, unspecified: Secondary | ICD-10-CM

## 2015-10-19 LAB — LIPASE, BLOOD: Lipase: 27 U/L (ref 11–51)

## 2015-10-19 LAB — URINALYSIS, ROUTINE W REFLEX MICROSCOPIC
Bilirubin Urine: NEGATIVE
GLUCOSE, UA: NEGATIVE mg/dL
Hgb urine dipstick: NEGATIVE
KETONES UR: 15 mg/dL — AB
Leukocytes, UA: NEGATIVE
Nitrite: NEGATIVE
PROTEIN: NEGATIVE mg/dL
Specific Gravity, Urine: 1.015 (ref 1.005–1.030)
pH: 8.5 — ABNORMAL HIGH (ref 5.0–8.0)

## 2015-10-19 LAB — COMPREHENSIVE METABOLIC PANEL
ALBUMIN: 3.3 g/dL — AB (ref 3.5–5.0)
ALK PHOS: 61 U/L (ref 38–126)
ALT: 79 U/L — ABNORMAL HIGH (ref 17–63)
ANION GAP: 11 (ref 5–15)
AST: 37 U/L (ref 15–41)
BILIRUBIN TOTAL: 1 mg/dL (ref 0.3–1.2)
BUN: 5 mg/dL — ABNORMAL LOW (ref 6–20)
CALCIUM: 9 mg/dL (ref 8.9–10.3)
CO2: 26 mmol/L (ref 22–32)
Chloride: 103 mmol/L (ref 101–111)
Creatinine, Ser: 0.76 mg/dL (ref 0.61–1.24)
GFR calc Af Amer: >60 mL/min (ref >60)
GFR calc non Af Amer: >60 mL/min (ref >60)
GLUCOSE: 116 mg/dL — AB (ref 65–99)
Potassium: 2.8 mmol/L — ABNORMAL LOW (ref 3.5–5.1)
Sodium: 140 mmol/L (ref 135–145)
TOTAL PROTEIN: 6.4 g/dL — AB (ref 6.5–8.1)

## 2015-10-19 LAB — CBC
HCT: 41 % (ref 39.0–52.0)
Hemoglobin: 13.6 g/dL (ref 13.0–17.0)
MCH: 30.2 pg (ref 26.0–34.0)
MCHC: 33.2 g/dL (ref 30.0–36.0)
MCV: 91.1 fL (ref 78.0–100.0)
PLATELETS: 298 10*3/uL (ref 150–400)
RBC: 4.5 MIL/uL (ref 4.22–5.81)
RDW: 13.3 % (ref 11.5–15.5)
WBC: 9.2 10*3/uL (ref 4.0–10.5)

## 2015-10-19 MED ORDER — SODIUM CHLORIDE 0.9 % IV BOLUS (SEPSIS)
1000.0000 mL | Freq: Once | INTRAVENOUS | Status: AC
  Administered 2015-10-19: 1000 mL via INTRAVENOUS

## 2015-10-19 MED ORDER — POTASSIUM CHLORIDE CRYS ER 20 MEQ PO TBCR
40.0000 meq | EXTENDED_RELEASE_TABLET | Freq: Once | ORAL | Status: AC
  Administered 2015-10-19: 40 meq via ORAL
  Filled 2015-10-19: qty 2

## 2015-10-19 MED ORDER — ONDANSETRON 4 MG PO TBDP: 4.0000 mg | ORAL_TABLET | Freq: Four times a day (QID) | ORAL | Status: DC | PRN

## 2015-10-19 MED ORDER — POTASSIUM CHLORIDE ER 20 MEQ PO TBCR: 40.0000 meq | EXTENDED_RELEASE_TABLET | Freq: Every day | ORAL | Status: DC

## 2015-10-19 MED ORDER — AZITHROMYCIN 250 MG PO TABS: 250.0000 mg | ORAL_TABLET | Freq: Every day | ORAL | Status: DC

## 2015-10-19 MED ORDER — ONDANSETRON HCL 4 MG/2ML IJ SOLN
4.0000 mg | Freq: Once | INTRAMUSCULAR | Status: AC
  Administered 2015-10-19: 4 mg via INTRAVENOUS
  Filled 2015-10-19: qty 2

## 2015-10-19 MED ORDER — KETOROLAC TROMETHAMINE 30 MG/ML IJ SOLN
30.0000 mg | Freq: Once | INTRAMUSCULAR | Status: AC
  Administered 2015-10-19: 30 mg via INTRAVENOUS
  Filled 2015-10-19: qty 1

## 2015-10-19 MED ORDER — ONDANSETRON 4 MG PO TBDP
4.0000 mg | ORAL_TABLET | Freq: Once | ORAL | Status: AC | PRN
  Administered 2015-10-19: 4 mg via ORAL

## 2015-10-19 MED ORDER — ONDANSETRON 4 MG PO TBDP
ORAL_TABLET | ORAL | Status: AC
  Filled 2015-10-19: qty 1

## 2015-10-19 NOTE — Discharge Instructions (Signed)
Your chest x-ray showed evidence of pneumonia..I will give you a prescription for antibiotics. Please take the entire course as prescribed. Your labs showed signs of dehydration. Your potassium is also low. I will give you a prescription for potassium supplementation to take for the next few days. I will also give you a prescription for nausea medicine. Take it on a regular basis to make sure you can keep down your other medications. Please call one of the clinics listed below to establish primary care for follow up. Return to the ER for new or worsening symptoms.  Take medications as prescribed. Return to the emergency room for worsening condition or new concerning symptoms. Follow up with your regular doctor. If you don't have a regular doctor use one of the numbers below to establish a primary care doctor.   Emergency Department Resource Guide 1) Find a Doctor and Pay Out of Pocket Although you won't have to find out who is covered by your insurance plan, it is a good idea to ask around and get recommendations. You will then need to call the office and see if the doctor you have chosen will accept you as a new patient and what types of options they offer for patients who are self-pay. Some doctors offer discounts or will set up payment plans for their patients who do not have insurance, but you will need to ask so you aren't surprised when you get to your appointment.  2) Contact Your Local Health Department Not all health departments have doctors that can see patients for sick visits, but many do, so it is worth a call to see if yours does. If you don't know where your local health department is, you can check in your phone book. The CDC also has a tool to help you locate your state's health department, and many state websites also have listings of all of their local health departments.  3) Find a Walk-in Clinic If your illness is not likely to be very severe or complicated, you may want to try a  walk in clinic. These are popping up all over the country in pharmacies, drugstores, and shopping centers. They're usually staffed by nurse practitioners or physician assistants that have been trained to treat common illnesses and complaints. They're usually fairly quick and inexpensive. However, if you have serious medical issues or chronic medical problems, these are probably not your best option.  No Primary Care Doctor: - Call Health Connect at  [phone] - they can help you locate a primary care doctor that  accepts your insurance, provides certain services, etc. - Physician Referral [phone]  Emergency Department Resource Guide 1) Find a Doctor and Pay Out of Pocket Although you won't have to find out who is covered by your insurance plan, it is a good idea to ask around and get recommendations. You will then need to call the office and see if the doctor you have chosen will accept you as a new patient and what types of options they offer for patients who are self-pay. Some doctors offer discounts or will set up payment plans for their patients who do not have insurance, but you will need to ask so you aren't surprised when you get to your appointment.  2) Contact Your Local Health Department Not all health departments have doctors that can see patients for sick visits, but many do, so it is worth a call to see if yours does. If you don't know where your local health department  is, you can check in your phone book. The CDC also has a tool to help you locate your state's health department, and many state websites also have listings of all of their local health departments.  3) Find a Walk-in Clinic If your illness is not likely to be very severe or complicated, you may want to try a walk in clinic. These are popping up all over the country in pharmacies, drugstores, and shopping centers. They're usually staffed by nurse practitioners or physician assistants that have been trained to  treat common illnesses and complaints. They're usually fairly quick and inexpensive. However, if you have serious medical issues or chronic medical problems, these are probably not your best option.  No Primary Care Doctor: - Call Health Connect at  [phone] - they can help you locate a primary care doctor that  accepts your insurance, provides certain services, etc. - Physician Referral Service- [phone]  Chronic Pain Problems: Organization         Address  Phone   Notes  [facility]  [phone] Patients need to be referred by their primary care doctor.   Medication Assistance: Organization         Address  Phone   Notes  [facility] Medication [facility] [address] [place], [place] [address] [phone] --Must be a resident of [place] -- Must have NO insurance coverage whatsoever (no Medicaid/ Medicare, etc.) -- The pt. MUST have a primary care doctor that directs their care regularly and follows them in the community   MedAssist  [phone]   [employer]  [phone]    Agencies that provide inexpensive medical care: Organization         Address  Phone   Notes  [facility]  [phone]   [facility]    [phone]   [facility] [address] [place], [place] [address] [phone]   [facility] [address], [place] [phone]   [facility]    [phone]   [facility]    [phone]   [facility] and [facility]  [address], [place] Phone:  [phone], Fax:  [phone] Hours of Operation:  9 am - 6 pm, M-F.  Also accepts Medicaid/Medicare and self-pay.  [facility]  [address], [place] Phone: [phone], Fax: [phone]. Hours of Operation:  8:30 am - 5:30 pm, M-F.  Also accepts Medicaid and self-pay.  [facility]  [place] [address], [place] Phone: [phone]   [facility] [address], [place], [place] [phone], Ext. [phone] Mondays & Thursdays: 7-9 AM.  First 15 patients are seen on a first come, first serve basis.    Medicaid-accepting [facility] Providers:  Organization         Address  Phone   Notes  [facility] [address], [place] [phone] Also accepts self-pay patients.  [facility] [address], [place]  [phone]) [phone]   [facility] [address], [place] [phone]   [facility] [address] [place] [address], [place] [phone]) [phone]   Renaye RakersVeita Bland [address], [place]   [phone] Only accepts [place] patients after they have their  name applied to their card.   Self-Pay (no insurance) in [facility]:  Organization         Address  Phone   Notes  Sickle Cell Patients, [facility] [address] [place], [place] [phone]   [facility] [address] [place], [place] [phone]   [facility]  [address], [place] [phone]   [facility]/Dr. Osei-Bonsu  [address], [place] or [address], [place] [phone] Phone number for both [place] and [place] locations is the same.  [facility] and [facility] [address], [place] [phone]   [facility] [address] [place] or [address] [phone]   [facility] [address] [place] [phone], phone; [phone], fax Sees patients 1st and 3rd Saturday of every month.  Must not qualify for public or private insurance (i.e. Medicaid, Medicare, [place] Health Choice, Veterans' Benefits)  Household income should be no more than 200%  of the poverty level The clinic cannot treat you if you are pregnant or think you are pregnant  Sexually transmitted diseases are not treated at the clinic.      Community-Acquired Pneumonia, Adult Pneumonia is an infection of the lungs. There are different types of pneumonia. One type can develop while a person is in a hospital. A different type, called community-acquired pneumonia, develops in people who are not, or have not recently been, in the hospital or other health care facility.  CAUSES Pneumonia may be caused by bacteria, viruses, or funguses. Community-acquired pneumonia is often caused by Streptococcus pneumonia bacteria. These bacteria are often passed from one person to another by breathing in droplets from the cough or sneeze of an infected person. RISK FACTORS The condition is more likely to develop in:  People who havechronic diseases, such as chronic obstructive pulmonary disease (COPD), asthma, congestive heart failure, cystic fibrosis, diabetes, or kidney disease.  People who haveearly-stage or late-stage HIV.  People who havesickle cell disease.  People who havehad their spleen removed (splenectomy).  People who havepoor Administrator.  People who havemedical conditions that increase the risk of breathing in (aspirating) secretions their own mouth and nose.   People who havea weakened immune system (immunocompromised).  People who smoke.  People whotravel to areas where pneumonia-causing germs commonly exist.  People whoare around animal habitats or animals that have pneumonia-causing germs, including birds, bats, rabbits, cats, and farm animals. SYMPTOMS Symptoms of this condition include:  Adry cough.  A wet (productive) cough.  Fever.  Sweating.  Chest pain, especially when breathing deeply or coughing.  Rapid breathing or difficulty breathing.  Shortness of breath.  Shaking chills.  Fatigue.  Muscle aches. DIAGNOSIS Your  health care provider will take a medical history and perform a physical exam. You may also have other tests, including:  Imaging studies of your chest, including X-rays.  Tests to check your blood oxygen level and other blood gases.  Other tests on blood, mucus (sputum), fluid around your lungs (pleural fluid), and urine. If your pneumonia is severe, other tests may be done to identify the specific cause of your illness. TREATMENT The type of treatment that you receive depends on many factors, such as the cause of your pneumonia, the medicines you take, and other medical conditions that you have. For most adults, treatment and recovery from pneumonia  may occur at home. In some cases, treatment must happen in a hospital. Treatment may include:  Antibiotic medicines, if the pneumonia was caused by bacteria.  Antiviral medicines, if the pneumonia was caused by a virus.  Medicines that are given by mouth or through an IV tube.  Oxygen.  Respiratory therapy. Although rare, treating severe pneumonia may include:  Mechanical ventilation. This is done if you are not breathing well on your own and you cannot maintain a safe blood oxygen level.  Thoracentesis. This procedureremoves fluid around one lung or both lungs to help you breathe better. HOME CARE INSTRUCTIONS  Take over-the-counter and prescription medicines only as told by your health care provider.  Only takecough medicine if you are losing sleep. Understand that cough medicine can prevent your body's natural ability to remove mucus from your lungs.  If you were prescribed an antibiotic medicine, take it as told by your health care provider. Do not stop taking the antibiotic even if you start to feel better.  Sleep in a semi-upright position at night. Try sleeping in a reclining chair, or place a few pillows under your head.  Do not use tobacco products, including cigarettes, chewing tobacco, and e-cigarettes. If you need help  quitting, ask your health care provider.  Drink enough water to keep your urine clear or pale yellow. This will help to thin out mucus secretions in your lungs. PREVENTION There are ways that you can decrease your risk of developing community-acquired pneumonia. Consider getting a pneumococcal vaccine if:  You are older than 25 years of age.  You are older than 25 years of age and are undergoing cancer treatment, have chronic lung disease, or have other medical conditions that affect your immune system. Ask your health care provider if this applies to you. There are different types and schedules of pneumococcal vaccines. Ask your health care provider which vaccination option is best for you. You may also prevent community-acquired pneumonia if you take these actions:  Get an influenza vaccine every year. Ask your health care provider which type of influenza vaccine is best for you.  Go to the dentist on a regular basis.  Wash your hands often. Use hand sanitizer if soap and water are not available. SEEK MEDICAL CARE IF:  You have a fever.  You are losing sleep because you cannot control your cough with cough medicine. SEEK IMMEDIATE MEDICAL CARE IF:  You have worsening shortness of breath.  You have increased chest pain.  Your sickness becomes worse, especially if you are an older adult or have a weakened immune system.  You cough up blood.   This information is not intended to replace advice given to you by your health care provider. Make sure you discuss any questions you have with your health care provider.   Document Released: 09/22/2005 Document Revised: 06/13/2015 Document Reviewed: 01/17/2015 Elsevier Interactive Patient Education [employer]

## 2015-10-19 NOTE — ED Notes (Signed)
PA at bedside.

## 2015-10-19 NOTE — ED Notes (Signed)
Patient transported to X-ray 

## 2015-10-19 NOTE — ED Provider Notes (Signed)
CSN: [id number]     Arrival date & time 10/19/15  1949 History  By signing my name below, I, [name], attest that this documentation has been prepared under the direction and in the presence of Quintyn Dombek Y [place], [place]. Electronically Signed: [name], ED Scribe. 10/19/2015. 10:22 PM.     Chief Complaint  Patient presents with  . Emesis   The history is provided by the patient and a relative. No language interpreter was used.    HPI Comments: Nicholas Blankenship is a 25 y.o. male with a PMHx of asthma who presents to the Emergency Department complaining of a constant, moderate, productive cough with onset 2 weeks ago. His relative notes he's experiencing associated nausea, 2x vomiting today, body aches, subjective fevers (98.7 F in triage), throat swelling which began 2 days ago, difficulty swallowing due to pain and sore throat. He was seen 2x in the past 2 weeks for similar symptoms and denies long term relief with his prior treatment plans. Pt denies any abd pain or other associated symptoms at this time. Denies chest pain or SOB. States last episode of emesis was before zofran was given in triage. States he has no more anti emetics at home. States he has mucinex and cough medicine which provide no relief. Staets he has no required inhaler use.   Past Medical History  Diagnosis Date  . Asthma    History reviewed. No pertinent past surgical history. No family history on file. Social History  Substance Use Topics  . Smoking status: Current Every Day Smoker -- 0.00 packs/day    Types: Cigarettes  . Smokeless tobacco: Never Used  . Alcohol Use: No    Review of Systems  All other systems reviewed and are negative.   Allergies  Review of patient's allergies indicates no known allergies.  Home Medications   Prior to Admission medications   Medication Sig Start Date End Date Taking? Authorizing Provider  acetaminophen (TYLENOL) 500 MG tablet Take 1,500 mg by mouth once as  needed for moderate pain.    Historical Provider, MD  albuterol (PROVENTIL HFA;VENTOLIN HFA) 108 (90 Base) MCG/ACT inhaler Inhale 2 puffs into the lungs every 6 (six) hours as needed for wheezing or shortness of breath. Reported on 10/12/2015    Historical Provider, MD  benzonatate (TESSALON) 100 MG capsule Take 1 capsule (100 mg total) by mouth every 8 (eight) hours. 10/13/15   Hannah Muthersbaugh, PA-C  fluticasone (FLONASE) 50 MCG/ACT nasal spray Place 2 sprays into both nostrils daily. 10/13/15   Hannah Muthersbaugh, PA-C  ibuprofen (ADVIL,MOTRIN) 200 MG tablet Take 800 mg by mouth every 6 (six) hours as needed for fever or moderate pain. Pain    Historical Provider, MD  ondansetron (ZOFRAN ODT) 4 MG disintegrating tablet Take 1 tablet (4 mg total) by mouth every 8 (eight) hours as needed for nausea. 10/12/15   Mady Gemma, PA-C  Phenylephrine-DM-GG-APAP (TYLENOL COLD/FLU SEVERE PO) Take 2 tablets by mouth every 4 (four) hours as needed (cold symptoms).    Historical Provider, MD  predniSONE (DELTASONE) 20 MG tablet Take 2 tablets (40 mg total) by mouth daily. 10/13/15   Hannah Muthersbaugh, PA-C   BP 121/63 mmHg  Pulse 89  Temp(Src) 98.7 F (37.1 C) (Oral)  Resp 18  SpO2 100%    Physical Exam  Constitutional: He is oriented to person, place, and time. He appears ill.  HENT:  Head: Normocephalic and atraumatic.  Right Ear: External ear normal.  Left Ear: External ear  normal.  Nose: Nose normal.  Mouth/Throat: Oropharynx is clear and moist. Mucous membranes are dry. No trismus in the jaw. No oropharyngeal exudate, posterior oropharyngeal edema or posterior oropharyngeal erythema.  Eyes: Conjunctivae and EOM are normal. Pupils are equal, round, and reactive to light.  Neck: Normal range of motion. Neck supple.  Cardiovascular: Normal rate, regular rhythm and normal heart sounds.   Pulmonary/Chest: Effort normal and breath sounds normal. No respiratory distress. He has no wheezes. He has  no rales. He exhibits no tenderness.  Coarse breath sounds. No wheezes, rhonchi, or rales.   Abdominal: Soft. Bowel sounds are normal. He exhibits no distension. There is no tenderness.  Musculoskeletal: Normal range of motion.  Lymphadenopathy:    He has no cervical adenopathy.  Neurological: He is alert and oriented to person, place, and time.  Skin: Skin is warm and dry. He is not diaphoretic. There is pallor.  Psychiatric: He has a normal mood and affect. His behavior is normal.  Nursing note and vitals reviewed.   ED Course  Procedures  DIAGNOSTIC STUDIES: Oxygen Saturation is 100% on RA, normal by my interpretation.    COORDINATION OF CARE: 10:18 PM Pt presents today due to a continuation of multiple symptoms. Discussed lab results and next steps with pt including CXR, IVF, Zofran, potassium and an rx for Zithromax.   Labs Review Labs Reviewed  COMPREHENSIVE METABOLIC PANEL - Abnormal; Notable for the following:    Potassium 2.8 (*)    Glucose, Bld 116 (*)    BUN 5 (*)    Total Protein 6.4 (*)    Albumin 3.3 (*)    ALT 79 (*)    All other components within normal limits  URINALYSIS, ROUTINE W REFLEX MICROSCOPIC (NOT AT [facility]) - Abnormal; Notable for the following:    Color, Urine AMBER (*)    pH 8.5 (*)    Ketones, ur 15 (*)    All other components within normal limits  LIPASE, BLOOD  CBC    Imaging Review Dg Chest 2 View  10/19/2015  CLINICAL DATA:  Fever and vomiting.  Sore throat EXAM: CHEST  2 VIEW COMPARISON:  10/12/2015 FINDINGS: Normal cardiac silhouette. There is subtle opacity in the lateral LEFT lung and at the LEFT lung base on lateral projection. RIGHT lung is clear. No pneumothorax. No osseous abnormality. IMPRESSION: Concern for early LEFT lower lobe pneumonia. Electronically Signed   By: Genevive Bi M.D.   On: 10/19/2015 23:13  evid I have personally reviewed and evaluated these images and lab results as part of my medical decision-making.   EKG  Interpretation None      MDM   Final diagnoses:  Non-intractable vomiting with nausea, vomiting of unspecified type  Hypokalemia  Community acquired pneumonia    Given duration of symptoms will obtain new CXR since last one was one week ago and pt feels like his symptoms are progressively getting worse. I discussed with pt and his mother that his symptoms are likely viral. He is afebrile, does not have a white count, is not tachycardic. Jhowever, he does look dehydrated. Will give 1L NS bolus. Will give toradol for aches and chills. Pt is hypokalemic to 2.9 today which I suspect is 2/2 GI loss. Will give PO supplementation and a prescription for k-dur at home as well. I discussed with pt and his mother that we will obtain CXR to r/o new pneumonia. However at this point given 2 week duration of cough I will send home  with z-pack to cover for atypicals even if CXR not apparent for pneumonia. Regarding sore throat, pt has no edema or erythema to his posterior oropharynx. He has no cervical LAD. He does have a cough. Low suspicion for strep pharyngitis based on CENTOR criteria.   CXR shows early left lower lob pneumonia. Will tx with azithromycin. k-dur rx also given. ER return precautions given. Resource guide given to establish pcp. Pt and his mother verbalized understanding.  I personally performed the services described in this documentation, which was scribed in my presence. The recorded information has been reviewed and is accurate.   Carlene CoriaSerena Y Odessia Asleson, PA-C 10/21/15 1253  Marily MemosJason Mesner, MD 10/22/15 [phone]

## 2015-10-19 NOTE — ED Notes (Addendum)
Pt here for congestion, productive cough, generalized body aches, sore throat and emesis x 2 today; symptoms ongoing for 2 weeks. [facility] for the same symptoms, dx with URI. He states the pill they gave him for the cough did not work.

## 2017-08-06 ENCOUNTER — Emergency Department (HOSPITAL_COMMUNITY): Payer: Self-pay

## 2017-08-06 ENCOUNTER — Emergency Department (HOSPITAL_COMMUNITY)
Admission: EM | Admit: 2017-08-06 | Discharge: 2017-08-06 | Disposition: A | Discharge status: Home / Self Care | Payer: Self-pay | Attending: Emergency Medicine | Admitting: Emergency Medicine

## 2017-08-06 ENCOUNTER — Encounter (HOSPITAL_COMMUNITY): Payer: Self-pay | Admitting: Emergency Medicine

## 2017-08-06 DIAGNOSIS — R0602 Shortness of breath: Secondary | ICD-10-CM | POA: Insufficient documentation

## 2017-08-06 DIAGNOSIS — R079 Chest pain, unspecified: Secondary | ICD-10-CM | POA: Insufficient documentation

## 2017-08-06 DIAGNOSIS — Z5321 Procedure and treatment not carried out due to patient leaving prior to being seen by health care provider: Secondary | ICD-10-CM | POA: Insufficient documentation

## 2017-08-06 LAB — BASIC METABOLIC PANEL
Anion gap: 9 (ref 5–15)
BUN: 14 mg/dL (ref 6–20)
CALCIUM: 9.4 mg/dL (ref 8.9–10.3)
CHLORIDE: 106 mmol/L (ref 101–111)
CO2: 22 mmol/L (ref 22–32)
CREATININE: 0.98 mg/dL (ref 0.61–1.24)
GFR calc Af Amer: >60 mL/min (ref >60)
GFR calc non Af Amer: >60 mL/min (ref >60)
Glucose, Bld: 105 mg/dL — ABNORMAL HIGH (ref 65–99)
Potassium: 3.5 mmol/L (ref 3.5–5.1)
SODIUM: 137 mmol/L (ref 135–145)

## 2017-08-06 LAB — I-STAT TROPONIN, ED: Troponin i, poc: 0 ng/mL (ref 0.00–0.08)

## 2017-08-06 LAB — CBC
HCT: 44 % (ref 39.0–52.0)
Hemoglobin: 15.2 g/dL (ref 13.0–17.0)
MCH: 31.4 pg (ref 26.0–34.0)
MCHC: 34.5 g/dL (ref 30.0–36.0)
MCV: 90.9 fL (ref 78.0–100.0)
PLATELETS: 254 10*3/uL (ref 150–400)
RBC: 4.84 MIL/uL (ref 4.22–5.81)
RDW: 12.8 % (ref 11.5–15.5)
WBC: 10.4 10*3/uL (ref 4.0–10.5)

## 2017-08-06 MED ORDER — ALBUTEROL SULFATE (2.5 MG/3ML) 0.083% IN NEBU
INHALATION_SOLUTION | RESPIRATORY_TRACT | Status: DC
Start: 2017-08-06 — End: 2017-08-06
  Filled 2017-08-06: qty 6

## 2017-08-06 MED ORDER — ALBUTEROL SULFATE (2.5 MG/3ML) 0.083% IN NEBU
5.0000 mg | INHALATION_SOLUTION | Freq: Once | RESPIRATORY_TRACT | Status: AC
  Administered 2017-08-06: 5 mg via RESPIRATORY_TRACT

## 2017-08-06 NOTE — ED Triage Notes (Signed)
Pt reports  CP described as tightness, SHOB, fingers tingling and lightheadedness x30 minutes prior to arrival.

## 2017-08-06 NOTE — ED Notes (Signed)
Per charge pt LWBS 

## 2018-10-27 ENCOUNTER — Encounter (HOSPITAL_COMMUNITY): Payer: Self-pay | Admitting: *Deleted

## 2018-10-27 ENCOUNTER — Emergency Department (HOSPITAL_COMMUNITY)
Admission: EM | Admit: 2018-10-27 | Discharge: 2018-10-27 | Disposition: A | Discharge status: Home / Self Care | Payer: Self-pay | Attending: Emergency Medicine | Admitting: Emergency Medicine

## 2018-10-27 ENCOUNTER — Emergency Department (HOSPITAL_COMMUNITY): Payer: Self-pay

## 2018-10-27 DIAGNOSIS — J111 Influenza due to unidentified influenza virus with other respiratory manifestations: Secondary | ICD-10-CM

## 2018-10-27 DIAGNOSIS — J45909 Unspecified asthma, uncomplicated: Secondary | ICD-10-CM | POA: Insufficient documentation

## 2018-10-27 DIAGNOSIS — R69 Illness, unspecified: Secondary | ICD-10-CM

## 2018-10-27 DIAGNOSIS — J101 Influenza due to other identified influenza virus with other respiratory manifestations: Secondary | ICD-10-CM | POA: Insufficient documentation

## 2018-10-27 DIAGNOSIS — Z79899 Other long term (current) drug therapy: Secondary | ICD-10-CM | POA: Insufficient documentation

## 2018-10-27 DIAGNOSIS — F17210 Nicotine dependence, cigarettes, uncomplicated: Secondary | ICD-10-CM | POA: Insufficient documentation

## 2018-10-27 MED ORDER — BENZONATATE 100 MG PO CAPS: 100.0000 mg | ORAL_CAPSULE | Freq: Three times a day (TID) | ORAL | 0 refills | Status: DC | PRN

## 2018-10-27 MED ORDER — FLUTICASONE PROPIONATE 50 MCG/ACT NA SUSP: 1.0000 | Freq: Every day | NASAL | 0 refills | Status: DC

## 2018-10-27 MED ORDER — NAPROXEN 500 MG PO TABS: 500.0000 mg | ORAL_TABLET | Freq: Two times a day (BID) | ORAL | 0 refills | Status: DC

## 2018-10-27 NOTE — Discharge Instructions (Addendum)
You were seen in the emergency today for upper respiratory symptoms, we suspect your symptoms are related to allergies or a virus at this time.  I have prescribed you multiple medications to treat your symptoms.   -Flonase to be used 1 spray in each nostril daily.  This medication is used to treat your congestion.  -Tessalon can be taken once every 8 hours as needed.  This medication is used to treat your cough.  - Naproxen is a nonsteroidal anti-inflammatory medication that will help with pain and swelling. Be sure to take this medication as prescribed with food, 1 pill every 12 hours,  It should be taken with food, as it can cause stomach upset, and more seriously, stomach bleeding. Do not take other nonsteroidal anti-inflammatory medications with this such as Advil, Motrin, Aleve, Mobic, Goodie Powder, or Motrin.    You make take Tylenol per over the counter dosing with these medications.   We have prescribed you new medication(s) today. Discuss the medications prescribed today with your pharmacist as they can have adverse effects and interactions with your other medicines including over the counter and prescribed medications. Seek medical evaluation if you start to experience new or abnormal symptoms after taking one of these medicines, seek care immediately if you start to experience difficulty breathing, feeling of your throat closing, facial swelling, or rash as these could be indications of a more serious allergic reaction  You will need to follow-up with your primary care provider in 1 week if your symptoms have not improved.  If you do not have a primary care provider one is provided in your discharge instructions.  Return to the emergency department for any new or worsening symptoms including but not limited to persistent fever for 5 days, difficulty breathing, chest pain, rashes, passing out, or any other concerns.

## 2018-10-27 NOTE — ED Triage Notes (Signed)
Pt in c/o cough and congestion since Monday, also fever and body aches, no distress noted

## 2018-10-27 NOTE — ED Notes (Signed)
Pt transported to Xray. 

## 2018-10-27 NOTE — ED Provider Notes (Signed)
[facility] Provider Note   CSN: [id number] Arrival date & time: 10/27/18  1058     History   Chief Complaint Chief Complaint  Patient presents with  . URI    HPI Layne BentonChristopher Vanderstelt is a 28 y.o. male without significant past medical hx who presents to the ED with complaints of flu like sxs x 3 days. Patient reports congestion, productive cough with mucous sputum, body aches, subjective fevers, and chills. No specific alleviating/aggravating factors. Taking tylenol cold/flu without much change. Fiance sick with similar was told viral. Denies dyspnea, chest pain, abdominal pain, vomiting, or diarrhea.   HPI  Past Medical History:  Diagnosis Date  . Asthma     There are no active problems to display for this patient.   History reviewed. No pertinent surgical history.      Home Medications    Prior to Admission medications   Medication Sig Start Date End Date Taking? Authorizing Provider  acetaminophen (TYLENOL) 500 MG tablet Take 1,500 mg by mouth once as needed for moderate pain.    [provider]  albuterol (PROVENTIL HFA;VENTOLIN HFA) 108 (90 Base) MCG/ACT inhaler Inhale 2 puffs into the lungs every 6 (six) hours as needed for wheezing or shortness of breath. Reported on 10/12/2015    [provider]  azithromycin (ZITHROMAX) 250 MG tablet Take 1 tablet (250 mg total) by mouth daily. Take first 2 tablets together, then 1 every day until finished. 10/19/15   Sam, Ace GinsSerena Y, PA-C  benzonatate (TESSALON) 100 MG capsule Take 1 capsule (100 mg total) by mouth every 8 (eight) hours. 10/13/15   Muthersbaugh, Dahlia ClientHannah, PA-C  fluticasone (FLONASE) 50 MCG/ACT nasal spray Place 2 sprays into both nostrils daily. 10/13/15   Muthersbaugh, Dahlia ClientHannah, PA-C  ibuprofen (ADVIL,MOTRIN) 200 MG tablet Take 800 mg by mouth every 6 (six) hours as needed for fever or moderate pain. Pain    [provider]  ondansetron (ZOFRAN ODT) 4 MG  disintegrating tablet Take 1 tablet (4 mg total) by mouth every 8 (eight) hours as needed for nausea. 10/12/15   Mady GemmaWestfall, Elizabeth C, PA-C  ondansetron (ZOFRAN ODT) 4 MG disintegrating tablet Take 1 tablet (4 mg total) by mouth 4 (four) times daily as needed for nausea or vomiting. 10/19/15   Sam, Ace GinsSerena Y, PA-C  Phenylephrine-DM-GG-APAP (TYLENOL COLD/FLU SEVERE PO) Take 2 tablets by mouth every 4 (four) hours as needed (cold symptoms).    [provider]  potassium chloride 20 MEQ TBCR Take 40 mEq by mouth daily. 10/19/15   Sam, Ace GinsSerena Y, PA-C  predniSONE (DELTASONE) 20 MG tablet Take 2 tablets (40 mg total) by mouth daily. 10/13/15   Muthersbaugh, Boyd KerbsHannah, PA-C    Family History History reviewed. No pertinent family history.  Social History Social History   Tobacco Use  . Smoking status: Current Every Day Smoker    Packs/day: 0.00    Types: Cigarettes  . Smokeless tobacco: Never Used  Substance Use Topics  . Alcohol use: No  . Drug use: No     Allergies   Patient has no known allergies.   Review of Systems Review of Systems  Constitutional: Positive for chills and fever.  HENT: Positive for congestion. Negative for ear pain and sore throat.   Respiratory: Positive for cough. Negative for shortness of breath.   Cardiovascular: Negative for chest pain.  Gastrointestinal: Negative for abdominal pain, diarrhea and vomiting.  Musculoskeletal: Positive for myalgias.     Physical Exam Updated Vital  Signs BP 111/77 (BP Location: Left Arm)   Pulse 88   Temp 97.9 F (36.6 C) (Oral)   Resp 16   SpO2 100%   Physical Exam Vitals signs and nursing note reviewed.  Constitutional:      General: He is not in acute distress.    Appearance: He is well-developed. He is not toxic-appearing.  HENT:     Head: Normocephalic and atraumatic.     Right Ear: Tympanic membrane, ear canal and external ear normal.     Left Ear: Tympanic membrane, ear canal and external ear normal.      Nose: Congestion present.     Right Sinus: No maxillary sinus tenderness or frontal sinus tenderness.     Left Sinus: No maxillary sinus tenderness or frontal sinus tenderness.     Mouth/Throat:     Mouth: Mucous membranes are moist.     Pharynx: Uvula midline. No oropharyngeal exudate or posterior oropharyngeal erythema.  Eyes:     General:        Right eye: No discharge.        Left eye: No discharge.     Conjunctiva/sclera: Conjunctivae normal.  Neck:     Musculoskeletal: Neck supple. No edema, neck rigidity or crepitus.  Cardiovascular:     Rate and Rhythm: Normal rate and regular rhythm.  Pulmonary:     Effort: Pulmonary effort is normal. No respiratory distress.     Breath sounds: Normal breath sounds. No wheezing, rhonchi or rales.  Abdominal:     General: There is no distension.     Palpations: Abdomen is soft.     Tenderness: There is no abdominal tenderness.  Skin:    General: Skin is warm and dry.     Findings: No rash.  Neurological:     Mental Status: He is alert.     Comments: Clear speech.   Psychiatric:        Behavior: Behavior normal.      ED Treatments / Results  Labs (all labs ordered are listed, but only abnormal results are displayed) Labs Reviewed - No data to display  EKG None  Radiology Dg Chest 2 View  Result Date: 10/27/2018 CLINICAL DATA:  Productive cough and congestion since Monday, also fever and body aches, EXAM: CHEST - 2 VIEW COMPARISON:  08/06/2017 FINDINGS: Lungs are clear. Heart size and mediastinal contours are within normal limits. No effusion. Visualized bones unremarkable. IMPRESSION: No acute cardiopulmonary disease. Electronically Signed   By: Corlis Leak  Hassell M.D.   On: 10/27/2018 12:19    Procedures Procedures (including critical care time)  Medications Ordered in ED Medications - No data to display   Initial Impression / Assessment and Plan / ED Course  I have reviewed the triage vital signs and the nursing  notes.  Pertinent labs & imaging results that were available during my care of the patient were reviewed by me and considered in my medical decision making (see chart for details).    Patient presents with influenza like symptoms.  Patient is nontoxic appearing, in no apparent distress, vitals are WNL. Patient is afebrile in the ED, lungs are CTA, CXR negative for infiltrate, doubt pneumonia. There is no wheezing or signs of respiratory distress. Sxs onset < 7 days, afebrile, no sinus tenderness, doubt acute bacterial sinusitis. Centor score 0, doubt strep pharyngitis. No evidence of AOM on exam. No meningeal signs. Suspect viral at this time and will treat supportively with Naproxen, Flonase, and Tessalon. Possibly influenza, no  medical comorbidities to necessitate tamiflu- discussed option of this with patient and he declined this medicine.   I discussed results, treatment plan, need for PCP follow-up, and return precautions with the patient. Provided opportunity for questions, patient confirmed understanding and is in agreement with plan.   Final Clinical Impressions(s) / ED Diagnoses   Final diagnoses:  Influenza-like illness    ED Discharge Orders         Ordered    fluticasone (FLONASE) 50 MCG/ACT nasal spray  Daily     10/27/18 1257    naproxen (NAPROSYN) 500 MG tablet  2 times daily     10/27/18 1257    benzonatate (TESSALON) 100 MG capsule  3 times daily PRN     10/27/18 [address] Pleas Koch, PA-C 10/27/18 1259    Pricilla Loveless, MD 10/27/18 Windell Moment

## 2020-02-26 ENCOUNTER — Other Ambulatory Visit: Payer: Self-pay

## 2020-02-26 ENCOUNTER — Emergency Department (HOSPITAL_COMMUNITY)
Admission: EM | Admit: 2020-02-26 | Discharge: 2020-02-26 | Disposition: A | Discharge status: Home / Self Care | Payer: Self-pay | Attending: Emergency Medicine | Admitting: Emergency Medicine

## 2020-02-26 DIAGNOSIS — M5412 Radiculopathy, cervical region: Secondary | ICD-10-CM

## 2020-02-26 DIAGNOSIS — M25512 Pain in left shoulder: Secondary | ICD-10-CM | POA: Insufficient documentation

## 2020-02-26 DIAGNOSIS — J45909 Unspecified asthma, uncomplicated: Secondary | ICD-10-CM | POA: Insufficient documentation

## 2020-02-26 DIAGNOSIS — M542 Cervicalgia: Secondary | ICD-10-CM | POA: Insufficient documentation

## 2020-02-26 DIAGNOSIS — F17210 Nicotine dependence, cigarettes, uncomplicated: Secondary | ICD-10-CM | POA: Insufficient documentation

## 2020-02-26 MED ORDER — NAPROXEN 500 MG PO TABS
500.0000 mg | ORAL_TABLET | Freq: Once | ORAL | Status: AC
  Administered 2020-02-26: 500 mg via ORAL
  Filled 2020-02-26: qty 1

## 2020-02-26 MED ORDER — METHOCARBAMOL 500 MG PO TABS: 500.0000 mg | ORAL_TABLET | Freq: Two times a day (BID) | ORAL | 0 refills | Status: DC

## 2020-02-26 MED ORDER — LIDOCAINE 5 % EX PTCH
1.0000 | MEDICATED_PATCH | CUTANEOUS | Status: DC
  Administered 2020-02-26: 1 via TRANSDERMAL
  Filled 2020-02-26: qty 1

## 2020-02-26 MED ORDER — NAPROXEN 500 MG PO TABS: 500.0000 mg | ORAL_TABLET | Freq: Two times a day (BID) | ORAL | 0 refills | Status: DC

## 2020-02-26 NOTE — ED Provider Notes (Signed)
[facility]-EMERGENCY DEPT Provider Note   CSN: [id number] Arrival date & time: 02/26/20  1242     History Chief Complaint  Patient presents with  . Arm Pain  . Neck Pain    Nicholas Blankenship is a 29 y.o. male with a past medical history significant for asthma who presents to the ED due to left-sided neck pain that radiates down his left arm that has gradually gotten worse over the past month.  Denies injury to neck.  Denies overlying rash.  Patient notes intermittent episodes of numbness/tingling down left arm which is worse when he sits down.  Numbness/tingling and pain relieved when standing up and laying flat.  He has tried over-the-counter ibuprofen with moderate relief.  Denies fever, chills, IV drug use. Denies weakness of LUE.   History obtained from patient and past medical records. No interpreter used during encounter.      Past Medical History:  Diagnosis Date  . Asthma     There are no problems to display for this patient.   No past surgical history on file.     No family history on file.  Social History   Tobacco Use  . Smoking status: Current Every Day Smoker    Packs/day: 0.00    Types: Cigarettes  . Smokeless tobacco: Never Used  Substance Use Topics  . Alcohol use: No  . Drug use: No    Home Medications Prior to Admission medications   Medication Sig Start Date End Date Taking? Authorizing Provider  acetaminophen (TYLENOL) 500 MG tablet Take 1,500 mg by mouth once as needed for moderate pain.    [provider]  albuterol (PROVENTIL HFA;VENTOLIN HFA) 108 (90 Base) MCG/ACT inhaler Inhale 2 puffs into the lungs every 6 (six) hours as needed for wheezing or shortness of breath. Reported on 10/12/2015    [provider]  azithromycin (ZITHROMAX) 250 MG tablet Take 1 tablet (250 mg total) by mouth daily. Take first 2 tablets together, then 1 every day until finished. 10/19/15   Sam, Ace Gins, PA-C  benzonatate  (TESSALON) 100 MG capsule Take 1 capsule (100 mg total) by mouth 3 (three) times daily as needed for cough. 10/27/18   Petrucelli, Samantha R, PA-C  fluticasone (FLONASE) 50 MCG/ACT nasal spray Place 1 spray into both nostrils daily. 10/27/18   Petrucelli, Samantha R, PA-C  ibuprofen (ADVIL,MOTRIN) 200 MG tablet Take 800 mg by mouth every 6 (six) hours as needed for fever or moderate pain. Pain    [provider]  naproxen (NAPROSYN) 500 MG tablet Take 1 tablet (500 mg total) by mouth 2 (two) times daily. 10/27/18   Petrucelli, Samantha R, PA-C  ondansetron (ZOFRAN ODT) 4 MG disintegrating tablet Take 1 tablet (4 mg total) by mouth every 8 (eight) hours as needed for nausea. 10/12/15   Mady Gemma, PA-C  ondansetron (ZOFRAN ODT) 4 MG disintegrating tablet Take 1 tablet (4 mg total) by mouth 4 (four) times daily as needed for nausea or vomiting. 10/19/15   Sam, Ace Gins, PA-C  Phenylephrine-DM-GG-APAP (TYLENOL COLD/FLU SEVERE PO) Take 2 tablets by mouth every 4 (four) hours as needed (cold symptoms).    [provider]  potassium chloride 20 MEQ TBCR Take 40 mEq by mouth daily. 10/19/15   Sam, Ace Gins, PA-C  predniSONE (DELTASONE) 20 MG tablet Take 2 tablets (40 mg total) by mouth daily. 10/13/15   Muthersbaugh, Dahlia Client, PA-C    Allergies    Patient has no known allergies.  Review of Systems   Review of Systems  Constitutional: Negative for chills and fever.  Musculoskeletal: Positive for arthralgias, myalgias and neck pain. Negative for back pain.  Neurological: Positive for numbness. Negative for weakness.  All other systems reviewed and are negative.   Physical Exam Updated Vital Signs BP 120/77 (BP Location: Right Arm)   Pulse 99   Temp 98.1 F (36.7 C) (Oral)   Resp 17   Ht 5\' 10"  (1.778 m)   Wt 72.6 kg   SpO2 97%   BMI 22.96 kg/m   Physical Exam Vitals and nursing note reviewed.  Constitutional:      General: He is not in acute distress.    Appearance:  He is not ill-appearing.  HENT:     Head: Normocephalic.  Eyes:     Pupils: Pupils are equal, round, and reactive to light.  Neck:     Comments: No cervical midline tenderness.  Full range of motion of neck.  Tenderness palpation over left trapezius muscle into the left shoulder.  Cardiovascular:     Rate and Rhythm: Normal rate and regular rhythm.     Pulses: Normal pulses.     Heart sounds: Normal heart sounds. No murmur. No friction rub. No gallop.   Pulmonary:     Effort: Pulmonary effort is normal.     Breath sounds: Normal breath sounds.  Abdominal:     General: Abdomen is flat. There is no distension.     Palpations: Abdomen is soft.     Tenderness: There is no abdominal tenderness. There is no guarding or rebound.  Musculoskeletal:     Cervical back: Neck supple.     Comments: Tenderness to palpation over deltoid muscle. Radial pulse intact.  Sensation intact.  Full range of motion of left shoulder, elbow, and wrist.  Soft compartments.  Skin:    General: Skin is warm and dry.     Comments: No overlying rash.  Neurological:     General: No focal deficit present.     Mental Status: He is alert.  Psychiatric:        Mood and Affect: Mood normal.        Behavior: Behavior normal.     ED Results / Procedures / Treatments   Labs (all labs ordered are listed, but only abnormal results are displayed) Labs Reviewed - No data to display  EKG None  Radiology No results found.  Procedures Procedures (including critical care time)  Medications Ordered in ED Medications  lidocaine (LIDODERM) 5 % 1 patch (has no administration in time range)  naproxen (NAPROSYN) tablet 500 mg (has no administration in time range)    ED Course  I have reviewed the triage vital signs and the nursing notes.  Pertinent labs & imaging results that were available during my care of the patient were reviewed by me and considered in my medical decision making (see chart for details).      MDM Rules/Calculators/A&P                     29 year old male presents to the ED due to left-sided neck pain that radiates down left arm associated with intermittent episodes of numbness/tingling that is progressively gotten worse over the past week.  No injury.  Denies fever, chills, and IV drug use.  Vitals all within normal limits.  Physical exam reassuring.  No cervical midline tenderness.  Tenderness to palpation over left trapezius muscle and deltoid muscles. LUE neurovascularly intact  with soft compartments. Given there is no cervical midline tenderness, no cervical CT warranted at this time. No concern for central cord compression at this time. Suspect symptoms related to cervical radiculopathy where patient will most likely need MRI in outpatient setting. Will treat symptomatic at this time with naproxen, lidoderm patches, and muscle relaxer. Orthopedic follow-up given to patient for further evaluation if symptoms do not improve over the next week. Strict ED precautions discussed with patient. Patient states understanding and agrees to plan. Patient discharged home in no acute distress and stable vitals.  Final Clinical Impression(s) / ED Diagnoses Final diagnoses:  Neck pain  Acute pain of left shoulder    Rx / DC Orders ED Discharge Orders    None       Jesusita Oka 02/26/20 1356    Terrilee Files, MD 02/26/20 1910

## 2020-02-26 NOTE — Discharge Instructions (Addendum)
As discussed, I am sending you home with a pain medication and muscle relaxer. Take as prescribed. Muscle relaxer can cause drowsiness, so do not drive or operate machinery while on the medication. You may also purchase over the counter lidoderm patches and voltaren gel for added pain relief.  Return to the ER for new or worsening symptoms.  I have included the number of the orthopedic doctor. Please call if symptoms do not start to improve over the next week.

## 2020-02-26 NOTE — ED Triage Notes (Signed)
Per patient , he has a pain in neck and it has now started going down left arm. Patient reports it started a month ago and is gradually getting worse.

## 2021-01-01 ENCOUNTER — Emergency Department (HOSPITAL_COMMUNITY): Payer: Self-pay

## 2021-01-01 ENCOUNTER — Other Ambulatory Visit: Payer: Self-pay

## 2021-01-01 ENCOUNTER — Encounter (HOSPITAL_COMMUNITY): Payer: Self-pay

## 2021-01-01 ENCOUNTER — Emergency Department (HOSPITAL_COMMUNITY)
Admission: EM | Admit: 2021-01-01 | Discharge: 2021-01-01 | Disposition: A | Discharge status: Home / Self Care | Payer: Self-pay | Attending: Emergency Medicine | Admitting: Emergency Medicine

## 2021-01-01 DIAGNOSIS — J45909 Unspecified asthma, uncomplicated: Secondary | ICD-10-CM | POA: Insufficient documentation

## 2021-01-01 DIAGNOSIS — F17210 Nicotine dependence, cigarettes, uncomplicated: Secondary | ICD-10-CM | POA: Insufficient documentation

## 2021-01-01 DIAGNOSIS — K219 Gastro-esophageal reflux disease without esophagitis: Secondary | ICD-10-CM | POA: Insufficient documentation

## 2021-01-01 DIAGNOSIS — R1013 Epigastric pain: Secondary | ICD-10-CM | POA: Insufficient documentation

## 2021-01-01 DIAGNOSIS — R0789 Other chest pain: Secondary | ICD-10-CM | POA: Insufficient documentation

## 2021-01-01 DIAGNOSIS — R059 Cough, unspecified: Secondary | ICD-10-CM | POA: Insufficient documentation

## 2021-01-01 DIAGNOSIS — R0602 Shortness of breath: Secondary | ICD-10-CM | POA: Insufficient documentation

## 2021-01-01 DIAGNOSIS — Z7951 Long term (current) use of inhaled steroids: Secondary | ICD-10-CM | POA: Insufficient documentation

## 2021-01-01 LAB — CBC
HCT: 47.6 % (ref 39.0–52.0)
Hemoglobin: 15.9 g/dL (ref 13.0–17.0)
MCH: 31.4 pg (ref 26.0–34.0)
MCHC: 33.4 g/dL (ref 30.0–36.0)
MCV: 93.9 fL (ref 80.0–100.0)
Platelets: 258 10*3/uL (ref 150–400)
RBC: 5.07 MIL/uL (ref 4.22–5.81)
RDW: 13 % (ref 11.5–15.5)
WBC: 9.1 10*3/uL (ref 4.0–10.5)
nRBC: 0 % (ref 0.0–0.2)

## 2021-01-01 LAB — BASIC METABOLIC PANEL
Anion gap: 7 (ref 5–15)
BUN: 9 mg/dL (ref 6–20)
CO2: 24 mmol/L (ref 22–32)
Calcium: 9.6 mg/dL (ref 8.9–10.3)
Chloride: 106 mmol/L (ref 98–111)
Creatinine, Ser: 0.61 mg/dL (ref 0.61–1.24)
GFR, Estimated: >60 mL/min (ref >60)
Glucose, Bld: 96 mg/dL (ref 70–99)
Potassium: 3.9 mmol/L (ref 3.5–5.1)
Sodium: 137 mmol/L (ref 135–145)

## 2021-01-01 LAB — TROPONIN I (HIGH SENSITIVITY): Troponin I (High Sensitivity): <2 ng/L (ref <18)

## 2021-01-01 MED ORDER — LIDOCAINE VISCOUS HCL 2 % MT SOLN
15.0000 mL | Freq: Once | OROMUCOSAL | Status: AC
  Administered 2021-01-01: 15 mL via ORAL
  Filled 2021-01-01: qty 15

## 2021-01-01 MED ORDER — ALBUTEROL SULFATE HFA 108 (90 BASE) MCG/ACT IN AERS: 2.0000 | INHALATION_SPRAY | RESPIRATORY_TRACT | Status: DC | PRN

## 2021-01-01 MED ORDER — ALUM & MAG HYDROXIDE-SIMETH 200-200-20 MG/5ML PO SUSP
30.0000 mL | Freq: Once | ORAL | Status: AC
  Administered 2021-01-01: 30 mL via ORAL
  Filled 2021-01-01: qty 30

## 2021-01-01 MED ORDER — PANTOPRAZOLE SODIUM 20 MG PO TBEC: 20.0000 mg | DELAYED_RELEASE_TABLET | Freq: Two times a day (BID) | ORAL | 0 refills | Status: AC

## 2021-01-01 NOTE — ED Triage Notes (Signed)
Chief complaint charted on the wrong patient.

## 2021-01-01 NOTE — Discharge Instructions (Signed)
You have been evaluated for your chest pain.  Fortunately no evidence concerning for a heart attack during this ER visit.  Your chest x-ray is normal.  Your chest pain is likely due to heartburn.  Take Protonix as prescribed and follow instruction below.  Return if you have any concern.

## 2021-01-01 NOTE — ED Provider Notes (Signed)
[facility]-EMERGENCY DEPT Provider Note   CSN: [id number] Arrival date & time: 01/01/21  1123     History Chief Complaint  Patient presents with  . Gastroesophageal Reflux  . Chest Pain  . Shortness of Breath    Nicholas Blankenship is a 30 y.o. male.  The history is provided by the patient. No language interpreter was used.  Gastroesophageal Reflux Associated symptoms include chest pain and shortness of breath.  Chest Pain Associated symptoms: shortness of breath   Shortness of Breath Associated symptoms: chest pain      30 year old male significant history of asthma presenting for evaluation of chest pain.  Patient report developing epigastric pain that started yesterday morning.  Pain is described as an uncomfortable sensation, radiates towards his back with some mild shortness of breath and some dry cough.  Pain did improve by lunchtime but recurred at nighttime and throughout the day today which prompted his ER visit.  No associated fever, runny nose sneezing sore throat vomiting diarrhea or having black tarry stool.  Denies NSAIDs use.  Does admits to tobacco use approximately half a pack a day.  Does have family history of cardiac disease but no premature cardiac death.  Report occasional alcohol use but no heavy alcohol use.  Currently rates his pain a 6 out of 10.  No specific treatment tried.  Past Medical History:  Diagnosis Date  . Asthma     There are no problems to display for this patient.   No past surgical history on file.     No family history on file.  Social History   Tobacco Use  . Smoking status: Current Every Day Smoker    Packs/day: 0.00    Types: Cigarettes  . Smokeless tobacco: Never Used  Substance Use Topics  . Alcohol use: No  . Drug use: No    Home Medications Prior to Admission medications   Medication Sig Start Date End Date Taking? Authorizing Provider  acetaminophen (TYLENOL) 500 MG tablet Take 1,500 mg by  mouth once as needed for moderate pain.    [provider]  albuterol (PROVENTIL HFA;VENTOLIN HFA) 108 (90 Base) MCG/ACT inhaler Inhale 2 puffs into the lungs every 6 (six) hours as needed for wheezing or shortness of breath. Reported on 10/12/2015    [provider]  azithromycin (ZITHROMAX) 250 MG tablet Take 1 tablet (250 mg total) by mouth daily. Take first 2 tablets together, then 1 every day until finished. 10/19/15   Sam, Ace Gins, PA-C  benzonatate (TESSALON) 100 MG capsule Take 1 capsule (100 mg total) by mouth 3 (three) times daily as needed for cough. 10/27/18   Petrucelli, Samantha R, PA-C  fluticasone (FLONASE) 50 MCG/ACT nasal spray Place 1 spray into both nostrils daily. 10/27/18   Petrucelli, Samantha R, PA-C  ibuprofen (ADVIL,MOTRIN) 200 MG tablet Take 800 mg by mouth every 6 (six) hours as needed for fever or moderate pain. Pain    [provider]  methocarbamol (ROBAXIN) 500 MG tablet Take 1 tablet (500 mg total) by mouth 2 (two) times daily. 02/26/20   Mannie Stabile, PA-C  naproxen (NAPROSYN) 500 MG tablet Take 1 tablet (500 mg total) by mouth 2 (two) times daily. 10/27/18   Petrucelli, Samantha R, PA-C  naproxen (NAPROSYN) 500 MG tablet Take 1 tablet (500 mg total) by mouth 2 (two) times daily. 02/26/20   Mannie Stabile, PA-C  ondansetron (ZOFRAN ODT) 4 MG disintegrating tablet Take 1 tablet (4 mg total)  by mouth every 8 (eight) hours as needed for nausea. 10/12/15   Mady Gemma, PA-C  ondansetron (ZOFRAN ODT) 4 MG disintegrating tablet Take 1 tablet (4 mg total) by mouth 4 (four) times daily as needed for nausea or vomiting. 10/19/15   Sam, Ace Gins, PA-C  Phenylephrine-DM-GG-APAP (TYLENOL COLD/FLU SEVERE PO) Take 2 tablets by mouth every 4 (four) hours as needed (cold symptoms).    [provider]  potassium chloride 20 MEQ TBCR Take 40 mEq by mouth daily. 10/19/15   Sam, Ace Gins, PA-C  predniSONE (DELTASONE) 20 MG tablet Take 2  tablets (40 mg total) by mouth daily. 10/13/15   Muthersbaugh, Dahlia Client, PA-C    Allergies    Patient has no known allergies.  Review of Systems   Review of Systems  Respiratory: Positive for shortness of breath.   Cardiovascular: Positive for chest pain.  All other systems reviewed and are negative.   Physical Exam Updated Vital Signs BP 131/79 (BP Location: Left Arm)   Pulse 84   Temp 97.7 F (36.5 C) (Oral)   Resp 17   Ht 5\' 10"  (1.778 m)   Wt 74.8 kg   SpO2 100%   BMI 23.68 kg/m   Physical Exam Vitals and nursing note reviewed.  Constitutional:      General: He is not in acute distress.    Appearance: He is well-developed.  HENT:     Head: Atraumatic.  Eyes:     Conjunctiva/sclera: Conjunctivae normal.  Cardiovascular:     Rate and Rhythm: Normal rate and regular rhythm.     Pulses: Normal pulses.     Heart sounds: Normal heart sounds.  Pulmonary:     Effort: Pulmonary effort is normal.     Breath sounds: Normal breath sounds. No wheezing or rhonchi.  Abdominal:     Palpations: Abdomen is soft.     Tenderness: There is abdominal tenderness (Mild epigastric tenderness without guarding or rebound tenderness.).  Musculoskeletal:     Cervical back: Neck supple.  Skin:    Capillary Refill: Capillary refill takes less than 2 seconds.     Findings: No rash.  Neurological:     Mental Status: He is alert and oriented to person, place, and time.  Psychiatric:        Mood and Affect: Mood normal.     ED Results / Procedures / Treatments   Labs (all labs ordered are listed, but only abnormal results are displayed) Labs Reviewed  BASIC METABOLIC PANEL  CBC  TROPONIN I (HIGH SENSITIVITY)    EKG EKG Interpretation  Date/Time:  Tuesday January 01 2021 11:32:31 EDT Ventricular Rate:  85 PR Interval:  151 QRS Duration: 77 QT Interval:  356 QTC Calculation: 424 R Axis:   220 Text Interpretation: Sinus rhythm Nonspecific T abnormalities, lateral leads ST elev,  probable normal early repol pattern 12 Lead; Mason-Likar No significant change since last tracing Confirmed by 1976 [phone]) on 01/01/2021 12:35:09 PM   Radiology DG Chest 2 View  Result Date: 01/01/2021 CLINICAL DATA:  Chest pain, shortness of breath, and upper abdominal pain. EXAM: CHEST - 2 VIEW COMPARISON:  10/27/2018 FINDINGS: The cardiomediastinal silhouette is within normal limits. The lungs are well inflated and clear. There is no evidence of pleural effusion or pneumothorax. No acute osseous abnormality is identified. IMPRESSION: No active cardiopulmonary disease. Electronically Signed   By: 10/29/2018 M.D.   On: 01/01/2021 12:24    Procedures Procedures   Medications Ordered in  ED Medications  albuterol (VENTOLIN HFA) 108 (90 Base) MCG/ACT inhaler 2 puff (has no administration in time range)  alum & mag hydroxide-simeth (MAALOX/MYLANTA) 200-200-20 MG/5ML suspension 30 mL (30 mLs Oral Given 01/01/21 1348)    And  lidocaine (XYLOCAINE) 2 % viscous mouth solution 15 mL (15 mLs Oral Given 01/01/21 1348)    ED Course  I have reviewed the triage vital signs and the nursing notes.  Pertinent labs & imaging results that were available during my care of the patient were reviewed by me and considered in my medical decision making (see chart for details).    MDM Rules/Calculators/A&P                          BP 136/84   Pulse 72   Temp 97.7 F (36.5 C) (Oral)   Resp 15   Ht 5\' 10"  (1.778 m)   Wt 74.8 kg   SpO2 100%   BMI 23.68 kg/m   Final Clinical Impression(s) / ED Diagnoses Final diagnoses:  Atypical chest pain    Rx / DC Orders ED Discharge Orders         Ordered    pantoprazole (PROTONIX) 20 MG tablet  2 times daily before meals        01/01/21 1441         1:44 PM Patient here with chest pain but sounds more epigastric pain.  Pain likely suggestive of gastritis/GERD.  Pain is atypical of ACS.  Patient is PERC negative, doubt PE. GI cocktail given. HEART  score of 2, low risk of MACE.    2:34 PM Work-up essentially unremarkable.  After receiving GI cocktail patient reported improvement of his symptoms.  At this time patient stable for discharge.  And will prescribe PPI, outpatient follow-up recommended.  Return precaution given.   01/03/21, PA-C 01/01/21 1441    01/03/21, DO 01/01/21 1446

## 2021-01-01 NOTE — ED Triage Notes (Signed)
Patient c/o upper abdominal pain, SOB, and mid chest pain that radiates into the mid back since yesterday. Patient also c/o GERD.

## 2021-10-03 ENCOUNTER — Emergency Department (HOSPITAL_COMMUNITY)
Admission: EM | Admit: 2021-10-03 | Discharge: 2021-10-03 | Disposition: A | Discharge status: Home / Self Care | Payer: Self-pay | Attending: Emergency Medicine | Admitting: Emergency Medicine

## 2021-10-03 ENCOUNTER — Emergency Department (HOSPITAL_COMMUNITY): Payer: Self-pay

## 2021-10-03 ENCOUNTER — Other Ambulatory Visit: Payer: Self-pay

## 2021-10-03 DIAGNOSIS — F17210 Nicotine dependence, cigarettes, uncomplicated: Secondary | ICD-10-CM | POA: Insufficient documentation

## 2021-10-03 DIAGNOSIS — J45909 Unspecified asthma, uncomplicated: Secondary | ICD-10-CM | POA: Insufficient documentation

## 2021-10-03 DIAGNOSIS — R000 Tachycardia, unspecified: Secondary | ICD-10-CM | POA: Insufficient documentation

## 2021-10-03 DIAGNOSIS — U071 COVID-19: Secondary | ICD-10-CM | POA: Insufficient documentation

## 2021-10-03 DIAGNOSIS — Z28310 Unvaccinated for covid-19: Secondary | ICD-10-CM | POA: Insufficient documentation

## 2021-10-03 LAB — RESP PANEL BY RT-PCR (FLU A&B, COVID) ARPGX2
Influenza A by PCR: NEGATIVE
Influenza B by PCR: NEGATIVE
SARS Coronavirus 2 by RT PCR: POSITIVE — AB

## 2021-10-03 MED ORDER — KETOROLAC TROMETHAMINE 15 MG/ML IJ SOLN
30.0000 mg | Freq: Once | INTRAMUSCULAR | Status: AC
  Administered 2021-10-03: 22:00:00 30 mg via INTRAMUSCULAR
  Filled 2021-10-03: qty 2

## 2021-10-03 MED ORDER — ALBUTEROL SULFATE HFA 108 (90 BASE) MCG/ACT IN AERS
2.0000 | INHALATION_SPRAY | RESPIRATORY_TRACT | Status: DC | PRN
  Administered 2021-10-03: 22:00:00 2 via RESPIRATORY_TRACT
  Filled 2021-10-03: qty 6.7

## 2021-10-03 MED ORDER — ACETAMINOPHEN 325 MG PO TABS
650.0000 mg | ORAL_TABLET | Freq: Once | ORAL | Status: AC
  Administered 2021-10-03: 22:00:00 650 mg via ORAL
  Filled 2021-10-03: qty 2

## 2021-10-03 MED ORDER — PREDNISONE 20 MG PO TABS
40.0000 mg | ORAL_TABLET | Freq: Once | ORAL | Status: AC
  Administered 2021-10-03: 22:00:00 40 mg via ORAL
  Filled 2021-10-03: qty 2

## 2021-10-03 MED ORDER — ACETAMINOPHEN 325 MG PO TABS: 650.0000 mg | ORAL_TABLET | Freq: Once | ORAL | Status: DC | PRN

## 2021-10-03 MED ORDER — NIRMATRELVIR/RITONAVIR (PAXLOVID)TABLET: 3.0000 | ORAL_TABLET | Freq: Two times a day (BID) | ORAL | 0 refills | Status: AC

## 2021-10-03 MED ORDER — PREDNISONE 20 MG PO TABS: 40.0000 mg | ORAL_TABLET | Freq: Every day | ORAL | 0 refills | Status: AC

## 2021-10-03 MED ORDER — ONDANSETRON 4 MG PO TBDP: 4.0000 mg | ORAL_TABLET | Freq: Three times a day (TID) | ORAL | 0 refills | Status: AC | PRN

## 2021-10-03 MED ORDER — ACETAMINOPHEN 500 MG PO TABS
1000.0000 mg | ORAL_TABLET | Freq: Once | ORAL | Status: DC | PRN
  Filled 2021-10-03: qty 2

## 2021-10-03 NOTE — ED Notes (Signed)
Patient moved to appropriate seating area 

## 2021-10-03 NOTE — Discharge Instructions (Signed)
Please read and follow all provided instructions.  Your diagnoses today include:  1. COVID-19     Tests performed today include: Vital signs. See below for your results today.  COVID test - POSITIVE  Medications prescribed:  Prednisone - steroid medicine   It is best to take this medication in the morning to prevent sleeping problems. If you are diabetic, monitor your blood sugar closely and stop taking Prednisone if blood sugar is over 300. Take with food to prevent stomach upset.   Albuterol inhaler - medication that opens up your airway  Use inhaler as follows: 1-2 puffs with spacer every 4 hours as needed for wheezing, cough, or shortness of breath.   Zofran (ondansetron) - for nausea and vomiting  Paxlovid - antiviral medication for COVID-19.  This may help decrease the severity of your illness and make it a little shorter.  Main side effects are typically vomiting and diarrhea.  Take any prescribed medications only as directed. Treatment for your infection is aimed at treating the symptoms. There are no medications, such as antibiotics, that will cure your infection.   Home care instructions:  Follow any educational materials contained in this packet.   Your illness is contagious and can be spread to others, especially during the first 3 or 4 days. It cannot be cured by antibiotics or other medicines. Take basic precautions such as washing your hands often, covering your mouth when you cough or sneeze, and avoiding public places where you could spread your illness to others.   Please continue drinking plenty of fluids.  Use over-the-counter medicines as needed as directed on packaging for symptom relief.  You may also use ibuprofen or tylenol as directed on packaging for pain or fever.  Do not take multiple medicines containing Tylenol or acetaminophen to avoid taking too much of this medication.  If you are positive for Covid-19, you should isolate yourself and not be exposed  to other people for 5 days after your symptoms began. If you are not feeling better at day 5, you need to isolate yourself for a total of 10 days. If you are feeling better by day 5, you should wear a mask properly, over your nose and mouth, at all times while around other people until 10 days after your symptoms started.   Follow-up instructions: Please follow-up with your primary care provider as needed for further evaluation of your symptoms if you are not feeling better.   Return instructions:  Please return to the Emergency Department if you experience worsening symptoms.  Return to the emergency department if you have worsening shortness of breath breathing or increased work of breathing, persistent vomiting RETURN IMMEDIATELY IF you develop shortness of breath, confusion or altered mental status, a new rash, become dizzy, faint, or poorly responsive, or are unable to be cared for at home. Please return if you have persistent vomiting and cannot keep down fluids or develop a fever that is not controlled by tylenol or motrin.   Please return if you have any other emergent concerns.  Additional Information:  Your vital signs today were: BP 124/66    Pulse (!) 122    Temp 98.8 F (37.1 C) (Oral)    Resp 20    Ht 5\' 10"  (1.778 m)    Wt 72.6 kg    SpO2 99%    BMI 22.96 kg/m  If your blood pressure (BP) was elevated above 135/85 this visit, please have this repeated by your doctor within one  month. --------------

## 2021-10-03 NOTE — ED Provider Notes (Signed)
Emergency Medicine Provider Triage Evaluation Note  Jubal Rademaker , a 30 y.o. male  was evaluated in triage.  Pt complains of URI-like symptoms onset 2 days. Pt with recent covid exposure over Christmas. Has associated body aches, chills, nausea, rhinorrhea, nasal congestion.  Has not tried any medications for his symptoms.  Denies abdominal pain, emesis, fever.  Review of Systems  Positive: Body aches, chills, nausea Negative: Abdominal pain  Physical Exam  BP 134/65 (BP Location: Left Arm)    Pulse (!) 114    Temp (!) 101.9 F (38.8 C) (Oral)    Resp (!) 24    Ht 5\' 10"  (1.778 m)    Wt 72.6 kg    SpO2 97%    BMI 22.96 kg/m  Gen:   Awake, no distress   Resp:  Normal effort  MSK:   Moves extremities without difficulty   Medical Decision Making  Medically screening exam initiated at 3:36 PM.  Appropriate orders placed.  Sylvanus Telford was informed that the remainder of the evaluation will be completed by another provider, this initial triage assessment does not replace that evaluation, and the importance of remaining in the ED until their evaluation is complete.   Emaya Preston A, PA-C 10/03/21 1542    10/05/21, MD 10/03/21 [phone]

## 2021-10-03 NOTE — ED Provider Notes (Signed)
[facility] EMERGENCY DEPARTMENT Provider Note   CSN: [id number] Arrival date & time: 10/03/21  1429     History Chief Complaint  Patient presents with   URI    [name] is a 30 y.o. male.  Patient presents to the emergency department today for 2 days of fever, chills, body aches, cough.  He has a history of asthma and has had some chest tightness.  Body aches are generalized.  Also has generalized headache.  Positive recent COVID exposure.  Reports vomiting this morning.  He has had nasal congestion and runny nose.  He has not had COVID before.  He has not been vaccinated against COVID.  He is a smoker. The onset of this condition was acute. The course is constant. Aggravating factors: none. Alleviating factors: none.        Past Medical History:  Diagnosis Date   Asthma     There are no problems to display for this patient.   No past surgical history on file.     No family history on file.  Social History   Tobacco Use   Smoking status: Every Day    Packs/day: 0.00    Types: Cigarettes   Smokeless tobacco: Never  Substance Use Topics   Alcohol use: No   Drug use: No    Home Medications Prior to Admission medications   Medication Sig Start Date End Date Taking? Authorizing Provider  acetaminophen (TYLENOL) 500 MG tablet Take 1,500 mg by mouth once as needed for moderate pain.    [provider]  pantoprazole (PROTONIX) 20 MG tablet Take 1 tablet (20 mg total) by mouth 2 (two) times daily before a meal. 01/01/21   Fayrene Helper, PA-C    Allergies    Patient has no known allergies.  Review of Systems   Review of Systems  Constitutional:  Positive for chills, fatigue and fever.  HENT:  Positive for congestion and rhinorrhea. Negative for ear pain, sinus pressure and sore throat.   Eyes:  Negative for redness.  Respiratory:  Positive for cough. Negative for wheezing.   Cardiovascular:  Negative for leg swelling.   Gastrointestinal:  Positive for nausea and vomiting. Negative for abdominal pain and diarrhea.  Genitourinary:  Negative for dysuria.  Musculoskeletal:  Positive for myalgias. Negative for neck stiffness.  Skin:  Negative for rash.  Neurological:  Positive for headaches.  Hematological:  Negative for adenopathy.   Physical Exam Updated Vital Signs BP 119/67 (BP Location: Right Arm)    Pulse (!) 106    Temp 98.8 F (37.1 C) (Oral)    Resp 20    Ht 5\' 10"  (1.778 m)    Wt 72.6 kg    SpO2 100%    BMI 22.96 kg/m   Physical Exam Vitals and nursing note reviewed.  Constitutional:      Appearance: He is well-developed.  HENT:     Head: Normocephalic and atraumatic.     Jaw: No trismus.     Right Ear: Tympanic membrane, ear canal and external ear normal.     Left Ear: Tympanic membrane, ear canal and external ear normal.     Nose: Nose normal. No mucosal edema or rhinorrhea.     Mouth/Throat:     Mouth: Mucous membranes are not dry.     Pharynx: Uvula midline. No oropharyngeal exudate, posterior oropharyngeal erythema or uvula swelling.     Tonsils: No tonsillar abscesses.  Eyes:     General:  Right eye: No discharge.        Left eye: No discharge.     Conjunctiva/sclera: Conjunctivae normal.  Cardiovascular:     Rate and Rhythm: Regular rhythm. Tachycardia present.     Heart sounds: Normal heart sounds.  Pulmonary:     Effort: Pulmonary effort is normal. No respiratory distress.     Breath sounds: Normal breath sounds. No wheezing or rales.  Abdominal:     Palpations: Abdomen is soft.     Tenderness: There is no abdominal tenderness.  Musculoskeletal:     Cervical back: Normal range of motion and neck supple.  Skin:    General: Skin is warm and dry.  Neurological:     Mental Status: He is alert.    ED Results / Procedures / Treatments   Labs (all labs ordered are listed, but only abnormal results are displayed) Labs Reviewed  RESP PANEL BY RT-PCR (FLU A&B, COVID)  ARPGX2 - Abnormal; Notable for the following components:      Result Value   SARS Coronavirus 2 by RT PCR POSITIVE (*)    All other components within normal limits    EKG None  Radiology DG Chest 2 View  Result Date: 10/03/2021 CLINICAL DATA:  Shortness of breath, cough, fever and body aches x3 days. History of asthma. EXAM: CHEST - 2 VIEW COMPARISON:  January 01, 2021 FINDINGS: The heart size and mediastinal contours are within normal limits. No focal consolidation. No pleural effusion. No pneumothorax. The visualized skeletal structures are unremarkable. IMPRESSION: No active cardiopulmonary disease. Electronically Signed   By: Maudry Mayhew M.D.   On: 10/03/2021 16:04    Procedures Procedures   Medications Ordered in ED Medications  acetaminophen (TYLENOL) tablet 1,000 mg (has no administration in time range)  ketorolac (TORADOL) 15 MG/ML injection 30 mg (has no administration in time range)  acetaminophen (TYLENOL) tablet 650 mg (has no administration in time range)  predniSONE (DELTASONE) tablet 40 mg (has no administration in time range)  albuterol (VENTOLIN HFA) 108 (90 Base) MCG/ACT inhaler 2 puff (has no administration in time range)    ED Course  I have reviewed the triage vital signs and the nursing notes.  Pertinent labs & imaging results that were available during my care of the patient were reviewed by me and considered in my medical decision making (see chart for details).  Patient seen and examined. Plan discussed with patient.  He looks like he is not feeling well, but not in any distress.  He is having chills, suspect fever is coming back.  Recheck temperature 99.7 F.  Labs: Positive COVID  Imaging: Negative chest x-ray for pneumonia  Medications/Fluids: In the ED, will give another dose of Tylenol, IM Toradol for body aches and headache, prednisone for chest tightness in setting of asthma.  Will be given inhaler for home.  Will discharge home with Paxlovid  2/2 risk factors of smoking, asthma, unvaccinated against COVID.     Vital signs reviewed and are as follows: BP 119/67 (BP Location: Right Arm)    Pulse (!) 106    Temp 98.8 F (37.1 C) (Oral)    Resp 20    Ht 5\' 10"  (1.778 m)    Wt 72.6 kg    SpO2 100%    BMI 22.96 kg/m   Initial impression: COVID-19 infection, no respiratory failure.  Patient does have tachycardia in setting of returning fever.  Tylenol ordered.  Detailed discussion had with with patient regarding COVID-19 precautions and  written instructions given as well.  We discussed need to isolate themselves for 5 days from onset of symptoms and have 24 hours of improvement prior to breaking isolation.  We discussed that when breaking isolation, mask wearing for 5 additional days is required.  We discussed signs symptoms to return which include worsening shortness of breath, trouble breathing, or increased work of breathing.  Also return with persistent vomiting, confusion, passing out, or if they have any other concerns. Counseled on the need for rest and good hydration. Discussed that high-risk contacts should be aware of positive result and they need to quarantine and be tested if they develop any symptoms. Patient verbalizes understanding.   Nicholas Blankenship was evaluated in Emergency Department on 10/03/2021 for the symptoms described in the history of present illness. He was evaluated in the context of the global COVID-19 pandemic, which necessitated consideration that the patient might be at risk for infection with the SARS-CoV-2 virus that causes COVID-19. Institutional protocols and algorithms that pertain to the evaluation of patients at risk for COVID-19 are in a state of rapid change based on information released by regulatory bodies including the CDC and federal and state organizations. These policies and algorithms were followed during the patient's care in the ED.    MDM Rules/Calculators/A&P                           Patient with COVID-19.  No respiratory failure.  He looks nontoxic.  Treat symptomatically.  Discussed Paxlovid indications, patient would like this prescribed.  Otherwise symptom control, rest hydration at home.     Final Clinical Impression(s) / ED Diagnoses Final diagnoses:  COVID-19    Rx / DC Orders ED Discharge Orders          Ordered    predniSONE (DELTASONE) 20 MG tablet  Daily        10/03/21 2222    nirmatrelvir/ritonavir EUA (PAXLOVID) 20 x 150 MG & 10 x 100MG  TABS  2 times daily        10/03/21 2222    ondansetron (ZOFRAN-ODT) 4 MG disintegrating tablet  Every 8 hours PRN        10/03/21 2222             10/05/21, PA-C 10/03/21 2329    10/05/21, MD 10/04/21 [phone]

## 2021-10-03 NOTE — ED Triage Notes (Signed)
Pt here with c/o of cold symptoms. Endorses generalized body aches, chills, nausea X2 days. COVID exposure.

## 2022-08-07 ENCOUNTER — Emergency Department (HOSPITAL_COMMUNITY): Payer: Self-pay

## 2022-08-07 ENCOUNTER — Emergency Department (HOSPITAL_COMMUNITY)
Admission: EM | Admit: 2022-08-07 | Discharge: 2022-08-07 | Disposition: A | Discharge status: Home / Self Care | Payer: Self-pay | Attending: Emergency Medicine | Admitting: Emergency Medicine

## 2022-08-07 ENCOUNTER — Encounter (HOSPITAL_COMMUNITY): Payer: Self-pay

## 2022-08-07 DIAGNOSIS — Z20822 Contact with and (suspected) exposure to covid-19: Secondary | ICD-10-CM | POA: Insufficient documentation

## 2022-08-07 DIAGNOSIS — J069 Acute upper respiratory infection, unspecified: Secondary | ICD-10-CM | POA: Insufficient documentation

## 2022-08-07 LAB — RESP PANEL BY RT-PCR (FLU A&B, COVID) ARPGX2
Influenza A by PCR: NEGATIVE
Influenza B by PCR: NEGATIVE
SARS Coronavirus 2 by RT PCR: NEGATIVE

## 2022-08-07 MED ORDER — IBUPROFEN 200 MG PO TABS
400.0000 mg | ORAL_TABLET | Freq: Once | ORAL | Status: AC
  Administered 2022-08-07: 400 mg via ORAL
  Filled 2022-08-07: qty 2

## 2022-08-07 MED ORDER — ACETAMINOPHEN 500 MG PO TABS
1000.0000 mg | ORAL_TABLET | Freq: Once | ORAL | Status: AC
  Administered 2022-08-07: 1000 mg via ORAL
  Filled 2022-08-07: qty 2

## 2022-08-07 NOTE — ED Provider Notes (Signed)
[facility]-EMERGENCY DEPT Provider Note   CSN: [id number] Arrival date & time: 08/07/22  0851     History  Chief Complaint  Patient presents with   Emesis   Cough    Nicholas Blankenship is a 31 y.o. male.  Pt c/o fever, cough occ prod sm amt phlegm, nasal congestion, rhinorrhea, low grade fever, mild body aches. Symptoms acute onset in past two days. Family member w recent uri symptoms. No other specific known ill contacts. No sob. No chest pain. Had episode of post tussive emesis, no bloody or bilious emesis. No abd pain.   The history is provided by the patient and medical records.  Emesis Associated symptoms: cough and fever   Associated symptoms: no abdominal pain, no diarrhea, no headaches and no sore throat   Cough Associated symptoms: fever and rhinorrhea   Associated symptoms: no chest pain, no eye discharge, no headaches, no rash, no shortness of breath and no sore throat        Home Medications Prior to Admission medications   Medication Sig Start Date End Date Taking? Authorizing Provider  acetaminophen (TYLENOL) 500 MG tablet Take 1,500 mg by mouth once as needed for moderate pain.    [provider]  ondansetron (ZOFRAN-ODT) 4 MG disintegrating tablet Take 1 tablet (4 mg total) by mouth every 8 (eight) hours as needed for nausea or vomiting. 10/03/21   Renne Crigler, PA-C  pantoprazole (PROTONIX) 20 MG tablet Take 1 tablet (20 mg total) by mouth 2 (two) times daily before a meal. 01/01/21   Fayrene Helper, PA-C  predniSONE (DELTASONE) 20 MG tablet Take 2 tablets (40 mg total) by mouth daily. 10/03/21   Renne Crigler, PA-C      Allergies    Patient has no known allergies.    Review of Systems   Review of Systems  Constitutional:  Positive for fever.  HENT:  Positive for congestion and rhinorrhea. Negative for sore throat and trouble swallowing.   Eyes:  Negative for discharge and redness.  Respiratory:  Positive for cough.  Negative for shortness of breath.   Cardiovascular:  Negative for chest pain.  Gastrointestinal:  Negative for abdominal pain and diarrhea.  Genitourinary:  Negative for dysuria and flank pain.  Musculoskeletal:  Negative for back pain and neck pain.  Skin:  Negative for rash.  Neurological:  Negative for headaches.  Hematological:  Does not bruise/bleed easily.  Psychiatric/Behavioral:  Negative for confusion.     Physical Exam Updated Vital Signs BP 129/74 (BP Location: Left Arm)   Pulse 94   Temp 98.1 F (36.7 C) (Oral)   Resp 15   Ht 1.778 m (5\' 10" )   Wt 74.8 kg   SpO2 94%   BMI 23.68 kg/m  Physical Exam Vitals and nursing note reviewed.  Constitutional:      Appearance: Normal appearance. He is well-developed.  HENT:     Head: Atraumatic.     Right Ear: Tympanic membrane normal.     Left Ear: Tympanic membrane normal.     Nose: Congestion and rhinorrhea present.     Mouth/Throat:     Mouth: Mucous membranes are moist.     Pharynx: Oropharynx is clear. No oropharyngeal exudate or posterior oropharyngeal erythema.  Eyes:     General: No scleral icterus.    Conjunctiva/sclera: Conjunctivae normal.     Pupils: Pupils are equal, round, and reactive to light.  Neck:     Trachea: No tracheal deviation.  Comments: No stiffness or rigidity.  Cardiovascular:     Rate and Rhythm: Normal rate and regular rhythm.     Pulses: Normal pulses.     Heart sounds: Normal heart sounds. No murmur heard.    No friction rub. No gallop.  Pulmonary:     Effort: Pulmonary effort is normal. No accessory muscle usage or respiratory distress.     Breath sounds: Normal breath sounds.  Abdominal:     General: There is no distension.     Palpations: Abdomen is soft.     Tenderness: There is no abdominal tenderness.  Musculoskeletal:        General: No swelling.     Cervical back: Normal range of motion and neck supple. No rigidity.     Right lower leg: No edema.     Left lower leg:  No edema.  Lymphadenopathy:     Cervical: No cervical adenopathy.  Skin:    General: Skin is warm and dry.     Findings: No rash.  Neurological:     Mental Status: He is alert.     Comments: Alert, speech clear.   Psychiatric:        Mood and Affect: Mood normal.     ED Results / Procedures / Treatments   Labs (all labs ordered are listed, but only abnormal results are displayed) Results for orders placed or performed during the hospital encounter of 08/07/22  Resp Panel by RT-PCR (Flu A&B, Covid) Anterior Nasal Swab   Specimen: Anterior Nasal Swab  Result Value Ref Range   SARS Coronavirus 2 by RT PCR NEGATIVE NEGATIVE   Influenza A by PCR NEGATIVE NEGATIVE   Influenza B by PCR NEGATIVE NEGATIVE   DG Chest 2 View  Result Date: 08/07/2022 CLINICAL DATA:  Cough EXAM: CHEST - 2 VIEW COMPARISON:  10/03/2021 FINDINGS: Normal heart size and mediastinal contours. No acute infiltrate or edema. No effusion or pneumothorax. No acute osseous findings. IMPRESSION: Negative chest. Electronically Signed   By: Jorje Guild M.D.   On: 08/07/2022 10:12     EKG None  Radiology DG Chest 2 View  Result Date: 08/07/2022 CLINICAL DATA:  Cough EXAM: CHEST - 2 VIEW COMPARISON:  10/03/2021 FINDINGS: Normal heart size and mediastinal contours. No acute infiltrate or edema. No effusion or pneumothorax. No acute osseous findings. IMPRESSION: Negative chest. Electronically Signed   By: Jorje Guild M.D.   On: 08/07/2022 10:12    Procedures Procedures    Medications Ordered in ED Medications - No data to display  ED Course/ Medical Decision Making/ A&P                           Medical Decision Making Problems Addressed: Viral URI with cough: acute illness or injury with systemic symptoms that poses a threat to life or bodily functions  Amount and/or Complexity of Data Reviewed External Data Reviewed: notes. Labs: ordered. Decision-making details documented in ED Course. Radiology:  ordered and independent interpretation performed. Decision-making details documented in ED Course.  Risk OTC drugs. Prescription drug management.   Labs and imaging ordered.  Reviewed nursing notes and prior charts for additional history.   Labs reviewed/interpreted by me - covid/flu neg.   CXR reviewed/interpreted by me - no pna.   Acetaminophen po. Ibuprofen po.   Symptoms most c/w viral uri.   Pt appears stable for d/c.  Return precautions provided.  Final Clinical Impression(s) / ED Diagnoses Final diagnoses:  None    Rx / DC Orders ED Discharge Orders     None         Cathren Laine, MD 08/07/22 1244

## 2022-08-07 NOTE — ED Triage Notes (Signed)
Pt c/o congestion, productive cough, some weakness, decreased appetite x2 days. Daughter recently sick. Work said he should get checked out. Fever up to 100.2 last night. Taking mucinex. Been throwing up when he tries to eat.

## 2022-08-07 NOTE — ED Triage Notes (Signed)
Kids tested negative for strep and flu

## 2022-08-07 NOTE — Discharge Instructions (Addendum)
It was our pleasure to provide your ER care today - we hope that you feel better. Your covid/flu tests are negative and xray shows no pneumonia.  Your symptoms appear most c/w a cold or flu-like viral illness that should get better in the next week - see attached info.   Drink plenty of fluids/stay well hydrated. Try mucinex, nyquil or similar cold/flu medication as need for symptom relief.   Follow up with primary care doctor in 1-2 weeks if symptoms fail to improve/resolve.  Return to ER if worse, new symptoms, trouble breathing, or other concern.

## 2022-09-04 IMAGING — CR DG CHEST 2V
2 series · 2 of 2 positions shown · non-contrast
Comparison: January 01, 2021

CLINICAL DATA: Shortness of breath, cough, fever and body aches x3
days. History of asthma.

EXAM:
CHEST - 2 VIEW

[chest lat]
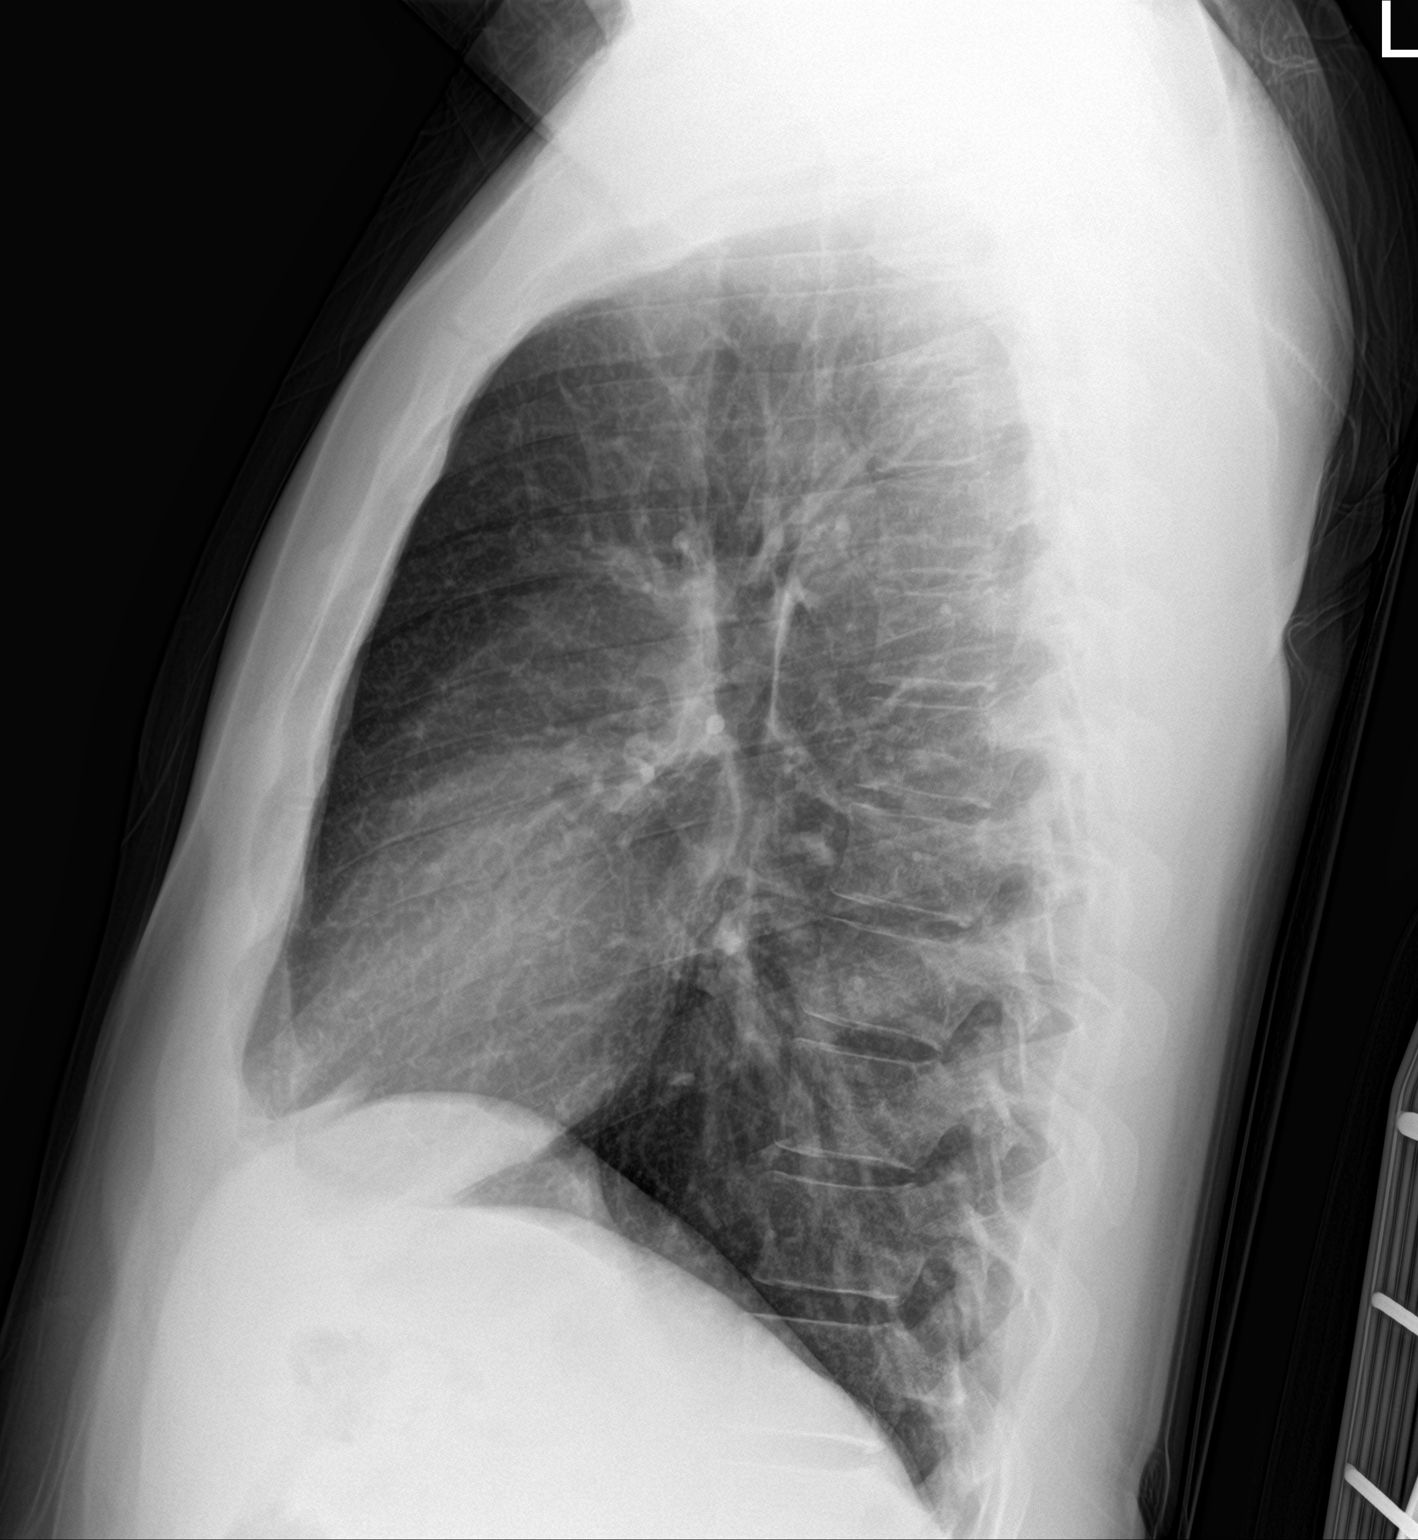

[chest ap]
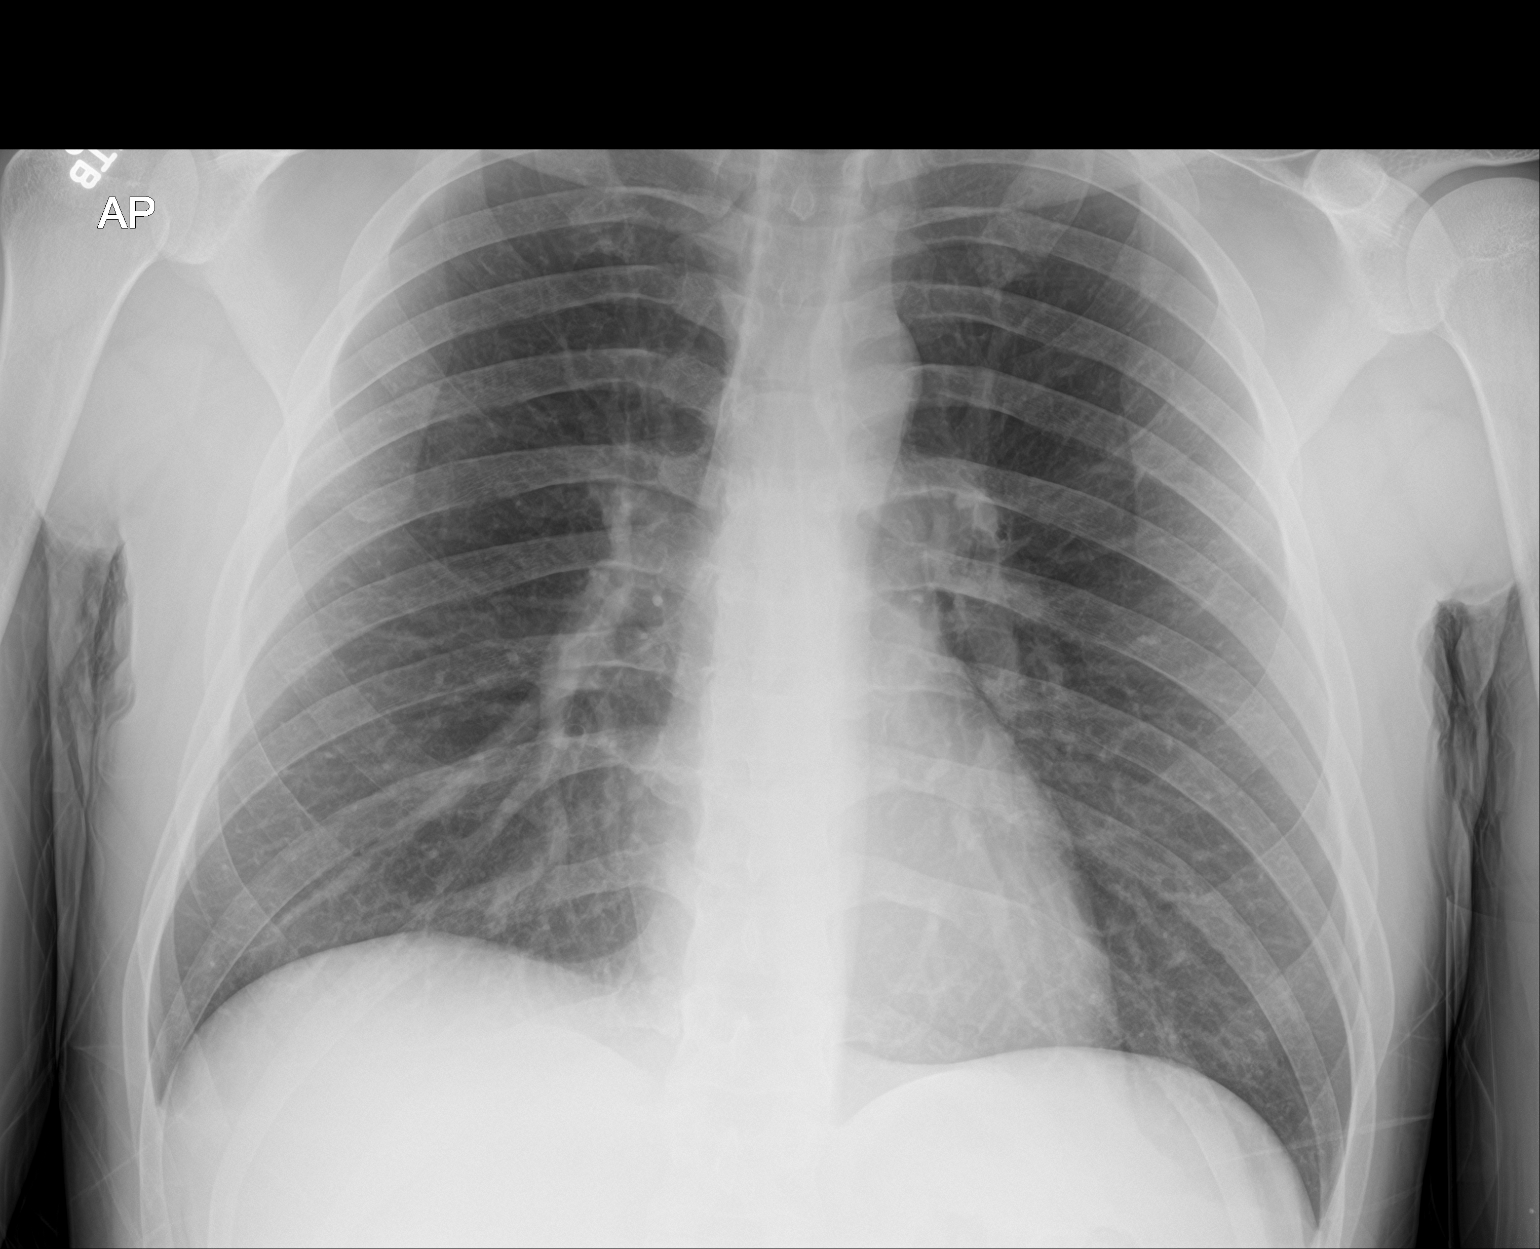

[2 of 2 positions shown; findings below may reference images not displayed]

FINDINGS: The heart size and mediastinal contours are within normal limits. No
focal consolidation. No pleural effusion. No pneumothorax. The
visualized skeletal structures are unremarkable.
IMPRESSION: No active cardiopulmonary disease.

## 2024-05-16 ENCOUNTER — Ambulatory Visit: Payer: Self-pay

## 2024-05-16 DIAGNOSIS — Z113 Encounter for screening for infections with a predominantly sexual mode of transmission: Secondary | ICD-10-CM

## 2024-05-16 DIAGNOSIS — A630 Anogenital (venereal) warts: Secondary | ICD-10-CM | POA: Insufficient documentation

## 2024-05-16 LAB — HM HIV SCREENING LAB: HM HIV Screening: NEGATIVE

## 2024-05-16 MED ORDER — IMIQUIMOD 5 % EX CREA: TOPICAL_CREAM | CUTANEOUS | 2 refills | Status: DC

## 2024-05-16 MED ORDER — IMIQUIMOD 5 % EX CREA: TOPICAL_CREAM | CUTANEOUS | 2 refills | Status: AC

## 2024-05-16 MED ORDER — IMIQUIMOD 5 % EX CREA: TOPICAL_CREAM | CUTANEOUS | 0 refills | Status: DC

## 2024-05-16 NOTE — Progress Notes (Signed)
 Patient here for STD screening. Condoms given. All questions answered and verbalizes understanding.   Clare Critchley, RN

## 2024-05-16 NOTE — Progress Notes (Signed)
 [facility] Department STI clinic [address], [address] [place] [address] Main phone: [phone]  STI screening visit  Subjective:  Nicholas Blankenship is a 33 y.o. male being seen today for an STI screening visit. The patient reports they do have symptoms.    Patient has the following medical conditions:  There are no active problems to display for this patient.  Chief Complaint  Patient presents with   SEXUALLY TRANSMITTED DISEASE   HPI Patient reports wart like lesions on his penis for 2 months. They are itchy, not painful. Unfortunately found out his partner of 8 years had sexual relationship outside of theirs. No other partners x8 years.  See flowsheet for further details and programmatic requirements  Hyperlink available at the top of the signed note in blue.  Flow sheet content below:  Pregnancy Intention Screening Does the patient want to become pregnant in the next year?: No Does the patient's partner want to become pregnant in the next year?: No Would the patient like to discuss contraceptive options today?: No Reason For STD Screen STD Screening: Has symptoms Have you ever had an STD?: No History of Antibiotic use in the past 2 weeks?: No STD Symptoms Denies all: No Genital Itching: Yes Lower abdominal pain: No Discharge: No Dysuria: No Genital ulcer / lesion: Yes Rash: No Oral / Other skin ulcer: No Pain with sex: No Sore Throat: No Visual Changes: No Risk Factors for Hep B Household, sexual, or needle sharing contact of a person infected with Hep B: No Sexual contact with a person who uses drugs not as prescribed?: No Currently or Ever used drugs not as prescribed: No HIV Positive: No PRep Patient: No Men who have sex with men: No Have Hepatitis C: No History of Incarceration: No History of Homeslessness?: No Anal sex following anal drug use?: No Risk Factors for Hep C Currently using drugs not as prescribed:  No Sexual partner(s) currently using drugs as not prescribed: No History of drug use: No HIV Positive: No People with a history of incarceration: No People born between the years of 88 and 87: No Advise Advised client to quit or stay quit. : Yes Abuse History Has patient ever been abused physically?: No Has patient ever been abused sexually?: No Does patient feel they have a problem with Anxiety?: No Does patient feel they have a problem with Depression?: Yes Referral to Behavioral Health: Yes Counseling Patient counseled to use condoms with all sex: Condoms given RTC in 2-3 weeks for test results: Yes Clinic will call if test results abnormal before test result appt.: Yes Test results given to patient Patient counseled to use condoms with all sex: Condoms given  Screening for MPX risk: Does the patient have an unexplained rash? No Is the patient MSM? No Does the patient endorse multiple sex partners or anonymous sex partners? No Did the patient have close or sexual contact with a person diagnosed with MPX? No Has the patient traveled outside the [place]  where MPX is endemic? No Is there a high clinical suspicion for MPX-- evidenced by one of the following No  -Unlikely to be chickenpox  -Lymphadenopathy  -Rash that present in same phase of evolution on any given body part  STI screening history: Last HIV test per patient/review of record was No results found for: HMHIVSCREEN No results found for: HIV  Last HEPC test per patient/review of record was No results found for: HMHEPCSCREEN No components found for: HEPC   Last HEPB  test per patient/review of record was No components found for: HMHEPBSCREEN   There is no immunization history on file for this patient.  The following portions of the patient's history were reviewed and updated as appropriate: allergies, current medications, past medical history, past social history, past surgical history and problem  list.  Objective:  There were no vitals filed for this visit.  Physical Exam Constitutional:      Appearance: Normal appearance.  HENT:     Head: Normocephalic and atraumatic.     Comments: No nits or hair loss    Mouth/Throat:     Mouth: Mucous membranes are moist.  Eyes:     General:        Right eye: No discharge.        Left eye: No discharge.     Conjunctiva/sclera:     Right eye: Right conjunctiva is not injected. No exudate.    Left eye: Left conjunctiva is not injected. No exudate. Pulmonary:     Effort: Pulmonary effort is normal.  Abdominal:     General: Abdomen is flat.     Palpations: Abdomen is soft. There is no hepatomegaly or mass.     Tenderness: There is no abdominal tenderness. There is no rebound.     Hernia: There is no hernia in the left inguinal area or right inguinal area.  Genitourinary:    Pubic Area: No rash or pubic lice (no nits).      Penis: Lesions present. No tenderness, discharge or swelling.      Testes: Normal.     Epididymis:     Right: Normal. No mass or tenderness.     Left: Normal. No mass or tenderness.     Rectum: Normal. No tenderness (no lesions or discharge).     Comments: Penile Discharge: none Scattered lesions consistent with genital warts on shaft of penis, ranging from 1 mm lesions to cauliflower-like clusters.  Lymphadenopathy:     Head:     Right side of head: No preauricular or posterior auricular adenopathy.     Left side of head: No preauricular or posterior auricular adenopathy.     Cervical: No cervical adenopathy.     Upper Body:     Right upper body: No supraclavicular, axillary or epitrochlear adenopathy.     Left upper body: No supraclavicular, axillary or epitrochlear adenopathy.     Lower Body: No right inguinal adenopathy. No left inguinal adenopathy.  Skin:    General: Skin is warm and dry.     Findings: No lesion or rash.  Neurological:     Mental Status: He is alert and oriented to person, place, and  time.    Assessment and Plan:  Nicholas Blankenship is a 33 y.o. male presenting to the [facility] Department for STI screening  1. Screening for venereal disease (Primary)  - HIV [facility] - Syphilis Serology, [facility] - Chlamydia/GC NAA, Confirmation  2. Genital warts  - Ambulatory referral to Dermatology - imiquimod  (ALDARA ) 5 % cream; Apply topically 3 (three) times a week.  Dispense: 12 each; Refill: 2  - Cryotherapy performed today. Discussed he can also use cream at home, especially in order to shrink some of the larger lesions so that they better respond to cryotherapy.   PROCEDURE: Cryotherapy   Lesions identified: 1. Multiple (15-20) lesions around diameter of shaft of penis  A test freeze was performed ensuring coverage of entire area as above.  The cryotherapy was  then applied for  seconds until an ice ball formed with a 5-7 mm border.  This was allowed to thaw and then repeated for a total of 3 cycles.   The patient tolerated the procedure well.  Return precautions provided.  Return to the office in 2-3 weeks for reevaluation.     Patient does have STI symptoms Patient accepted the following screenings: urine CT/GC, HIV, and RPR Patient meets criteria for HepB screening? No. Ordered? no Patient meets criteria for HepC screening? No. Ordered? no Recommended condom use with all sex Discussed importance of condom use for STI prevention  Treat positive test results per standing order. Discussed time line for [facility] results and that patient will be called with positive results and encouraged patient to call if he had not heard in 2 weeks Recommended repeat testing in 3 months with positive results. Recommended returning for continued or worsening symptoms.   Return in about 2 weeks (around 05/30/2024) for cryotherapy.  No future appointments.  Damien FORBES Satchel, NP

## 2024-05-18 LAB — CHLAMYDIA/GC NAA, CONFIRMATION
Chlamydia trachomatis, NAA: NEGATIVE
Neisseria gonorrhoeae, NAA: NEGATIVE

## 2024-05-23 ENCOUNTER — Telehealth: Payer: Self-pay | Admitting: Licensed Clinical Social Worker

## 2024-05-23 ENCOUNTER — Telehealth: Payer: Self-pay | Admitting: Family Medicine

## 2024-05-23 NOTE — Telephone Encounter (Signed)
 Patient called looking to speak with Damien Satchel, NP LCSW informed pt. That he would need to call the main number and they would take a message. LCSW also sent Damien a message via EPIC.

## 2024-05-24 ENCOUNTER — Telehealth: Payer: Self-pay | Admitting: Family Medicine

## 2024-05-25 NOTE — Telephone Encounter (Signed)
 See encounter Nicholas JONELLE Northern, RN

## 2024-05-31 ENCOUNTER — Ambulatory Visit: Payer: Self-pay | Admitting: Dermatology

## 2024-06-23 ENCOUNTER — Ambulatory Visit (LOCAL_COMMUNITY_HEALTH_CENTER): Payer: Self-pay

## 2024-06-23 DIAGNOSIS — Z719 Counseling, unspecified: Secondary | ICD-10-CM

## 2024-06-23 DIAGNOSIS — Z23 Encounter for immunization: Secondary | ICD-10-CM | POA: Diagnosis not present

## 2024-06-23 NOTE — Progress Notes (Signed)
 In clinic for immunizations, requesting HPV vaccine. Voices no concerns. VIS reviewed and given to patient. Vaccine (HPV) tolerated well; no issues noted. [facility] updated and copies given to patient.  Doyce CINDERELLA Shuck, RN

## 2024-07-21 ENCOUNTER — Ambulatory Visit (LOCAL_COMMUNITY_HEALTH_CENTER): Payer: Self-pay

## 2024-07-21 DIAGNOSIS — Z719 Counseling, unspecified: Secondary | ICD-10-CM

## 2024-07-21 DIAGNOSIS — Z23 Encounter for immunization: Secondary | ICD-10-CM

## 2024-07-21 NOTE — Progress Notes (Signed)
 Patient seen in nurse clinic for vaccination.  HPV 2nd dose given and tolerated well. VIS declined - already has one. [facility] updated and copy provided. Discussed return date for vaccine shot.

## 2024-09-07 ENCOUNTER — Ambulatory Visit: Admission: EM | Admit: 2024-09-07 | Discharge: 2024-09-07 | Disposition: A | Discharge status: Home / Self Care

## 2024-09-07 ENCOUNTER — Telehealth: Payer: Self-pay | Admitting: Emergency Medicine

## 2024-09-07 ENCOUNTER — Encounter: Payer: Self-pay | Admitting: Emergency Medicine

## 2024-09-07 DIAGNOSIS — J069 Acute upper respiratory infection, unspecified: Secondary | ICD-10-CM

## 2024-09-07 LAB — POC COVID19/FLU A&B COMBO
Covid Antigen, POC: NEGATIVE
Influenza A Antigen, POC: NEGATIVE
Influenza B Antigen, POC: NEGATIVE

## 2024-09-07 MED ORDER — BENZONATATE 100 MG PO CAPS: 100.0000 mg | ORAL_CAPSULE | Freq: Three times a day (TID) | ORAL | 0 refills | Status: AC

## 2024-09-07 NOTE — Telephone Encounter (Signed)
 Pt called in because he was seen in office 09/07/2024 and wants to know if his work note could release him to work sooner than 09/13/2024. Pt says he has access to his my chart and will be in the lookout for the letter to arrive there. The date he is looking to return to work is 09/09/2024.  Letter has been updated and generated. No further action required.

## 2024-09-07 NOTE — ED Triage Notes (Signed)
 Pt presents c/o cough and congestion x 3 days. Pt states,  I started feeling a little week on Monday. Yesterday when I woke up my throat was a little ithy. Today, my body aches, my throat is sore, and I'm coughing up mucus which is causing chest tightness. I also haven't been eating much.  Pt denies emesis and diarrhea.

## 2024-09-07 NOTE — Discharge Instructions (Signed)
 Neg for covid and flu.  You been diagnosed with a viral illness today. -Viruses have to run their course and medicines that are prescribed are meant to help with symptoms. - With viruses usually feel poorly from 3 to 7 days with cough being the last symptoms to resolve.  -Cough can linger from days to weeks.  Antibiotics are not effective for viruses. -If your cough lasts more than 2 weeks and you are coughing so hard that you are vomiting or feel like you could pass out we need to follow-up with PCP for further testing and evaluation. -Rest, increase water intake, may use pseudoephedrine for nasal congestion, Delsym (dextromethorphan) or honey as needed for cough, and ibuprofen  and/or Tylenol  as directed on packaging for pain and fever. -If you have hypertension you should take Coricidin or other OTC meds approved for people with high blood pressure. -You may use a spoonful of honey every 4-6 hours as needed for throat pain and cough. -Warm tea with honey and lemon are helpful for soothe throat as well.  Chloraseptic and Cepacol make a throat lozenge with numbing medication, can be purchased over-the-counter. -May also use Flonase  or sinus rinse for sinus pressure or nasal congestion.  Be sure to use distilled bottled water for sinus rinses. -May use coolmist humidifier to open up nasal passages -May elevate head to assist with postnasal drainage. -If you feel poorly (fever, fatigue, shortness of breath, nausea, etc.) for more than 10 days to be sure to follow-up with PCP or in clinic for further evaluation and additional treatments. If you experience chest pain with shortness of breath or pulse oxygen less than 95% you should report to the ER.

## 2024-09-07 NOTE — ED Provider Notes (Signed)
 [facility]    CSN: [id number] Arrival date & time: 09/07/24  0847      History   Chief Complaint Chief Complaint  Patient presents with   Nasal Congestion   Cough    HPI Nicholas Blankenship is a 33 y.o. male.   Patient presents today due to cough productive of yellow-greenish sputum, nasal drainage, nasal congestion, body aches, and reduced appetite since Monday.  Patient states has been taking DayQuil and NyQuil for symptoms with +/- relief.  Patient states that he works at [employer] and is around sick for all the time.  Patient denies fever, highest temp was 99.5 at home.  The history is provided by the patient.  Cough   Past Medical History:  Diagnosis Date   Asthma     Patient Active Problem List   Diagnosis Date Noted   Genital warts 05/16/2024    History reviewed. No pertinent surgical history.     Home Medications    Prior to Admission medications   Medication Sig Start Date End Date Taking? Authorizing Provider  acetaminophen  (TYLENOL ) 500 MG tablet Take 1,500 mg by mouth once as needed for moderate pain.    [provider]  imiquimod  (ALDARA ) 5 % cream Apply topically 3 (three) times a week. 05/16/24   Rosabel Damien BRAVO, NP  ondansetron  (ZOFRAN -ODT) 4 MG disintegrating tablet Take 1 tablet (4 mg total) by mouth every 8 (eight) hours as needed for nausea or vomiting. 10/03/21   Geiple, Joshua, PA-C  pantoprazole  (PROTONIX ) 20 MG tablet Take 1 tablet (20 mg total) by mouth 2 (two) times daily before a meal. 01/01/21   Nivia Colon, PA-C  predniSONE  (DELTASONE ) 20 MG tablet Take 2 tablets (40 mg total) by mouth daily. 10/03/21   Desiderio Chew, PA-C    Family History Family History  Problem Relation Age of Onset   Healthy Mother    Healthy Father     Social History Social History   Tobacco Use   Smoking status: Former    Current packs/day: 0.50    Types: Cigarettes    Passive exposure: Past   Smokeless tobacco: Never    Tobacco comments:    Quit line info given 05/16/24  Vaping Use   Vaping status: Never Used  Substance Use Topics   Alcohol use: No   Drug use: No     Allergies   Patient has no known allergies.   Review of Systems Review of Systems  Respiratory:  Positive for cough.      Physical Exam Triage Vital Signs ED Triage Vitals  Encounter Vitals Group     BP 09/07/24 0912 127/80     Girls Systolic BP Percentile --      Girls Diastolic BP Percentile --      Boys Systolic BP Percentile --      Boys Diastolic BP Percentile --      Pulse Rate 09/07/24 0912 87     Resp 09/07/24 0912 18     Temp 09/07/24 0912 97.8 F (36.6 C)     Temp Source 09/07/24 0912 Oral     SpO2 09/07/24 0912 97 %     Weight 09/07/24 0910 182 lb (82.6 kg)     Height --      Head Circumference --      Peak Flow --      Pain Score 09/07/24 0909 6     Pain Loc --      Pain Education --  Exclude from Growth Chart --    No data found.  Updated Vital Signs BP 127/80 (BP Location: Left Arm)   Pulse 87   Temp 97.8 F (36.6 C) (Oral)   Resp 18   Wt 182 lb (82.6 kg)   SpO2 97%   BMI 26.11 kg/m   Visual Acuity Right Eye Distance:   Left Eye Distance:   Bilateral Distance:    Right Eye Near:   Left Eye Near:    Bilateral Near:     Physical Exam Vitals and nursing note reviewed.  Constitutional:      General: He is not in acute distress.    Appearance: Normal appearance. He is not ill-appearing, toxic-appearing or diaphoretic.  HENT:     Nose: Congestion (mildly enlarged turbinates) present. No rhinorrhea.     Mouth/Throat:     Mouth: Mucous membranes are moist.     Pharynx: Oropharynx is clear. No oropharyngeal exudate or posterior oropharyngeal erythema.  Eyes:     General: No scleral icterus. Cardiovascular:     Rate and Rhythm: Normal rate and regular rhythm.     Heart sounds: Normal heart sounds.  Pulmonary:     Effort: Pulmonary effort is normal. No respiratory distress.      Breath sounds: Normal breath sounds. No wheezing or rhonchi.  Skin:    General: Skin is warm.  Neurological:     Mental Status: He is alert and oriented to person, place, and time.  Psychiatric:        Mood and Affect: Mood normal.        Behavior: Behavior normal.      UC Treatments / Results  Labs (all labs ordered are listed, but only abnormal results are displayed) Labs Reviewed  POC COVID19/FLU A&B COMBO - Normal    EKG   Radiology No results found.  Procedures Procedures (including critical care time)  Medications Ordered in UC Medications - No data to display  Initial Impression / Assessment and Plan / UC Course  I have reviewed the triage vital signs and the nursing notes.  Pertinent labs & imaging results that were available during my care of the patient were reviewed by me and considered in my medical decision making (see chart for details).    Final Clinical Impressions(s) / UC Diagnoses   Final diagnoses:  None   Discharge Instructions   None    ED Prescriptions   None    PDMP not reviewed this encounter.   Andra Corean BROCKS, PA-C 09/07/24 [phone]
